# Patient Record
Sex: Female | Born: 1985 | Race: Black or African American | Hispanic: No | Smoking: Light tobacco smoker
Health system: Southern US, Community
[De-identification: ages and names within clinical notes are randomized; demographics above are authoritative.]

## PROBLEM LIST (undated history)

## (undated) DIAGNOSIS — J45909 Unspecified asthma, uncomplicated: Secondary | ICD-10-CM

## (undated) DIAGNOSIS — E785 Hyperlipidemia, unspecified: Secondary | ICD-10-CM

## (undated) DIAGNOSIS — F1910 Other psychoactive substance abuse, uncomplicated: Secondary | ICD-10-CM

## (undated) DIAGNOSIS — I10 Essential (primary) hypertension: Secondary | ICD-10-CM

## (undated) DIAGNOSIS — K219 Gastro-esophageal reflux disease without esophagitis: Secondary | ICD-10-CM

## (undated) HISTORY — DX: Other psychoactive substance abuse, uncomplicated: F19.10

## (undated) HISTORY — DX: Gastro-esophageal reflux disease without esophagitis: K21.9

## (undated) HISTORY — DX: Unspecified asthma, uncomplicated: J45.909

## (undated) HISTORY — DX: Essential (primary) hypertension: I10

## (undated) HISTORY — DX: Hyperlipidemia, unspecified: E78.5

## (undated) HISTORY — PX: NO PAST SURGERIES: SHX2092

---

## 2006-12-26 ENCOUNTER — Other Ambulatory Visit: Admission: RE | Admit: 2006-12-26 | Discharge: 2006-12-26 | Payer: Self-pay | Admitting: Family Medicine

## 2007-12-29 ENCOUNTER — Other Ambulatory Visit: Admission: RE | Admit: 2007-12-29 | Discharge: 2007-12-29 | Payer: Self-pay | Admitting: Family Medicine

## 2017-09-09 ENCOUNTER — Emergency Department (HOSPITAL_COMMUNITY)
Admission: EM | Admit: 2017-09-09 | Discharge: 2017-09-09 | Disposition: A | Discharge status: Home / Self Care | Payer: BLUE CROSS/BLUE SHIELD | Attending: Emergency Medicine | Admitting: Emergency Medicine

## 2017-09-09 ENCOUNTER — Encounter (HOSPITAL_COMMUNITY): Payer: Self-pay | Admitting: Emergency Medicine

## 2017-09-09 ENCOUNTER — Emergency Department (HOSPITAL_COMMUNITY): Payer: BLUE CROSS/BLUE SHIELD

## 2017-09-09 DIAGNOSIS — J9801 Acute bronchospasm: Secondary | ICD-10-CM | POA: Insufficient documentation

## 2017-09-09 DIAGNOSIS — F17210 Nicotine dependence, cigarettes, uncomplicated: Secondary | ICD-10-CM | POA: Diagnosis not present

## 2017-09-09 DIAGNOSIS — R0602 Shortness of breath: Secondary | ICD-10-CM | POA: Diagnosis present

## 2017-09-09 DIAGNOSIS — J189 Pneumonia, unspecified organism: Secondary | ICD-10-CM | POA: Insufficient documentation

## 2017-09-09 LAB — COMPREHENSIVE METABOLIC PANEL
ALT: 29 U/L (ref 14–54)
AST: 39 U/L (ref 15–41)
Albumin: 4.4 g/dL (ref 3.5–5.0)
Alkaline Phosphatase: 87 U/L (ref 38–126)
Anion gap: 8 (ref 5–15)
BUN: 13 mg/dL (ref 6–20)
CO2: 26 mmol/L (ref 22–32)
Calcium: 9.5 mg/dL (ref 8.9–10.3)
Chloride: 106 mmol/L (ref 101–111)
Creatinine, Ser: 1.08 mg/dL — ABNORMAL HIGH (ref 0.44–1.00)
GFR calc Af Amer: >60 mL/min (ref >60)
Glucose, Bld: 115 mg/dL — ABNORMAL HIGH (ref 65–99)
Potassium: 3.4 mmol/L — ABNORMAL LOW (ref 3.5–5.1)
Sodium: 140 mmol/L (ref 135–145)
Total Bilirubin: 0.5 mg/dL (ref 0.3–1.2)
Total Protein: 8.2 g/dL — ABNORMAL HIGH (ref 6.5–8.1)

## 2017-09-09 LAB — CBC WITH DIFFERENTIAL/PLATELET
BASOS ABS: 0.1 10*3/uL (ref 0.0–0.1)
BASOS PCT: 0 %
Eosinophils Absolute: 0.6 10*3/uL (ref 0.0–0.7)
Eosinophils Relative: 3 %
HCT: 45.1 % (ref 36.0–46.0)
Hemoglobin: 15 g/dL (ref 12.0–15.0)
LYMPHS PCT: 14 %
Lymphs Abs: 2.9 10*3/uL (ref 0.7–4.0)
MCH: 29.2 pg (ref 26.0–34.0)
MCHC: 33.3 g/dL (ref 30.0–36.0)
MCV: 87.7 fL (ref 78.0–100.0)
MONO ABS: 2.1 10*3/uL — AB (ref 0.1–1.0)
Monocytes Relative: 10 %
Neutro Abs: 16 10*3/uL — ABNORMAL HIGH (ref 1.7–7.7)
Neutrophils Relative %: 73 %
PLATELETS: 320 10*3/uL (ref 150–400)
RBC: 5.14 MIL/uL — ABNORMAL HIGH (ref 3.87–5.11)
RDW: 14.4 % (ref 11.5–15.5)
WBC: 21.8 10*3/uL — ABNORMAL HIGH (ref 4.0–10.5)

## 2017-09-09 LAB — I-STAT BETA HCG BLOOD, ED (MC, WL, AP ONLY)

## 2017-09-09 MED ORDER — ALBUTEROL SULFATE (2.5 MG/3ML) 0.083% IN NEBU
5.0000 mg | INHALATION_SOLUTION | Freq: Once | RESPIRATORY_TRACT | Status: AC
  Administered 2017-09-09: 5 mg via RESPIRATORY_TRACT
  Filled 2017-09-09: qty 6

## 2017-09-09 MED ORDER — ALBUTEROL SULFATE HFA 108 (90 BASE) MCG/ACT IN AERS
1.0000 | INHALATION_SPRAY | RESPIRATORY_TRACT | Status: DC | PRN
Start: 2017-09-09 — End: 2017-09-09
  Administered 2017-09-09: 2 via RESPIRATORY_TRACT
  Filled 2017-09-09: qty 6.7

## 2017-09-09 MED ORDER — AZITHROMYCIN 250 MG PO TABS: 250.0000 mg | ORAL_TABLET | Freq: Every day | ORAL | 0 refills | Status: DC

## 2017-09-09 MED ORDER — SODIUM CHLORIDE 0.9 % IV SOLN
1.0000 g | Freq: Once | INTRAVENOUS | Status: AC
  Administered 2017-09-09: 1 g via INTRAVENOUS
  Filled 2017-09-09: qty 10

## 2017-09-09 MED ORDER — METHYLPREDNISOLONE SODIUM SUCC 125 MG IJ SOLR
125.0000 mg | Freq: Once | INTRAMUSCULAR | Status: AC
Start: 2017-09-09 — End: 2017-09-09
  Administered 2017-09-09: 125 mg via INTRAVENOUS
  Filled 2017-09-09: qty 2

## 2017-09-09 MED ORDER — SODIUM CHLORIDE 0.9 % IV BOLUS
1000.0000 mL | Freq: Once | INTRAVENOUS | Status: AC
  Administered 2017-09-09: 1000 mL via INTRAVENOUS

## 2017-09-09 MED ORDER — PREDNISONE 20 MG PO TABS: ORAL_TABLET | ORAL | 0 refills | Status: DC

## 2017-09-09 MED ORDER — SODIUM CHLORIDE 0.9 % IV SOLN
500.0000 mg | Freq: Once | INTRAVENOUS | Status: AC
  Administered 2017-09-09 (×2): 500 mg via INTRAVENOUS
  Filled 2017-09-09: qty 500

## 2017-09-09 MED ORDER — SODIUM CHLORIDE 0.9 % IV SOLN
INTRAVENOUS | Status: AC
  Administered 2017-09-09: 500 mg via INTRAVENOUS
  Filled 2017-09-09: qty 500

## 2017-09-09 NOTE — ED Triage Notes (Signed)
Pt reports cough for the past 2 days. Shortness of breath since this morning.  Pt hyperventilating on arrival to ED.

## 2017-09-09 NOTE — ED Provider Notes (Signed)
[facility] EMERGENCY DEPARTMENT Provider Note   CSN: [id number] Arrival date & time: 09/09/17  0820     History   Chief Complaint Chief Complaint  Patient presents with  . Shortness of Breath    HPI Whitney BeauDanielle Priego is a 32 y.o. female.  HPI Patient presents with wheezing, nonproductive cough, rhinorrhea and shortness of breath for the past 2 days.  Worsened overnight.  Denies chest pain.  She is had some subjective fevers and chills.  No new lower extremity swelling or pain.  No previous diagnosis of asthma though she states she has had bronchitis frequently as a child. History reviewed. No pertinent past medical history.  There are no active problems to display for this patient.   History reviewed. No pertinent surgical history.   OB History   None      Home Medications    Prior to Admission medications   Medication Sig Start Date End Date Taking? Authorizing Provider  azithromycin (ZITHROMAX) 250 MG tablet Take 1 tablet (250 mg total) by mouth daily. 09/10/17   Loren RacerYelverton, Sabirin Baray, MD  predniSONE (DELTASONE) 20 MG tablet 3 tabs po day one, then 2 po daily x 4 days 09/10/17   Loren RacerYelverton, Danecia Underdown, MD    Family History History reviewed. No pertinent family history.  Social History Social History   Tobacco Use  . Smoking status: Current Every Day Smoker    Packs/day: 1.00    Types: Cigarettes  Substance Use Topics  . Alcohol use: Yes    Comment: every other day-beer  . Drug use: Yes    Types: Marijuana     Allergies   Patient has no known allergies.   Review of Systems Review of Systems  Constitutional: Positive for chills and fever.  HENT: Positive for rhinorrhea. Negative for sore throat and trouble swallowing.   Respiratory: Positive for cough, shortness of breath and wheezing.   Cardiovascular: Negative for chest pain, palpitations and leg swelling.  Gastrointestinal: Negative for abdominal pain, diarrhea, nausea and vomiting.  Genitourinary: Negative  for dysuria, flank pain and frequency.  Musculoskeletal: Negative for back pain, myalgias, neck pain and neck stiffness.  Skin: Negative for rash and wound.  Neurological: Negative for dizziness, weakness, light-headedness, numbness and headaches.  All other systems reviewed and are negative.    Physical Exam Updated Vital Signs BP 105/70   Pulse (!) 108   Temp 98.3 F (36.8 C) (Oral)   Resp 20   Ht 5\' 4"  (1.626 m)   Wt 78.9 kg (174 lb)   LMP 08/14/2017   SpO2 98%   BMI 29.87 kg/m   Physical Exam  Constitutional: She is oriented to person, place, and time. She appears well-developed and well-nourished.  HENT:  Head: Normocephalic and atraumatic.  Mouth/Throat: Oropharynx is clear and moist. No oropharyngeal exudate.  Eyes: Pupils are equal, round, and reactive to light. EOM are normal.  Neck: Normal range of motion. Neck supple. No JVD present.  Cardiovascular: Regular rhythm. Exam reveals no gallop and no friction rub.  No murmur heard. Tachycardia  Pulmonary/Chest: No respiratory distress. She has wheezes. She has rales.  Increased work of breathing.  Inspiratory and expiratory wheezing throughout with few rhonchi and bilateral bases.  Abdominal: Soft. Bowel sounds are normal. There is no tenderness. There is no rebound and no guarding.  Musculoskeletal: Normal range of motion. She exhibits no edema or tenderness.  No lower extremity swelling, asymmetry or tenderness.  Lymphadenopathy:    She has no cervical adenopathy.  Neurological: She is alert and oriented to person, place, and time.  Moves all extremities without focal deficit.  Sensation intact.  Skin: Skin is warm and dry. Capillary refill takes less than 2 seconds. No rash noted. She is not diaphoretic. No erythema.  Psychiatric: She has a normal mood and affect. Her behavior is normal.  Nursing note and vitals reviewed.    ED Treatments / Results  Labs (all labs ordered are listed, but only abnormal results  are displayed) Labs Reviewed  CBC WITH DIFFERENTIAL/PLATELET - Abnormal; Notable for the following components:      Result Value   WBC 21.8 (*)    RBC 5.14 (*)    Neutro Abs 16.0 (*)    Monocytes Absolute 2.1 (*)    All other components within normal limits  COMPREHENSIVE METABOLIC PANEL - Abnormal; Notable for the following components:   Potassium 3.4 (*)    Glucose, Bld 115 (*)    Creatinine, Ser 1.08 (*)    Total Protein 8.2 (*)    All other components within normal limits  I-STAT BETA HCG BLOOD, ED ([facility], [facility], [facility] ONLY)    EKG None  Radiology Dg Chest 2 View  Result Date: 09/09/2017 CLINICAL DATA:  Left-sided chest discomfort and shortness of breath. Current smoker. EXAM: CHEST - 2 VIEW COMPARISON:  None in PACs FINDINGS: The lungs are adequately inflated. The lung markings are mildly increased in the lingula. There is no pleural effusion or pneumothorax. The heart and pulmonary vascularity are normal. The mediastinum is normal in width. The bony thorax is unremarkable. IMPRESSION: Subtle increased density in the lingula laterally suggest atelectasis or possible early pneumonia. Follow-up radiographs following anticipated antibiotic therapy are recommended to assure clearing. Expansile remodeling of the lateral aspect of the left seventh rib of uncertain etiology and duration. A left rib series is recommended. Electronically Signed   By: Osceola Depaz  [place] M.D.   On: 09/09/2017 10:19    Procedures Procedures (including critical care time)  Medications Ordered in ED Medications  albuterol (PROVENTIL HFA;VENTOLIN HFA) 108 (90 Base) MCG/ACT inhaler 1-2 puff (2 puffs Inhalation Given 09/09/17 1228)  albuterol (PROVENTIL) (2.5 MG/3ML) 0.083% nebulizer solution 5 mg (5 mg Nebulization Given 09/09/17 0927)  methylPREDNISolone sodium succinate (SOLU-MEDROL) 125 mg/2 mL injection 125 mg (125 mg Intravenous Given 09/09/17 0932)  cefTRIAXone (ROCEPHIN) 1 g in sodium chloride 0.9 % 100 mL IVPB (0 g  Intravenous Stopped 09/09/17 1128)  sodium chloride 0.9 % bolus 1,000 mL (0 mLs Intravenous Stopped 09/09/17 1128)  azithromycin (ZITHROMAX) 500 mg in sodium chloride 0.9 % 250 mL IVPB (0 mg Intravenous Stopped 09/09/17 1339)     Initial Impression / Assessment and Plan / ED Course  I have reviewed the triage vital signs and the nursing notes.  Pertinent labs & imaging results that were available during my care of the patient were reviewed by me and considered in my medical decision making (see chart for details).     Patient states he is feeling much better after albuterol treatment.  Lungs are now clear.  Tachycardia has improved.  Subtle left lingular infiltrate likely developing pneumonia.  Given dose of IV Rocephin IV azithromycin.  Will discharge with azithromycin and MDI.  Return precautions have been given.  Final Clinical Impressions(s) / ED Diagnoses   Final diagnoses:  Community acquired pneumonia, unspecified laterality  Bronchospasm    ED Discharge Orders        Ordered    predniSONE (DELTASONE) 20 MG tablet  09/09/17 1330    azithromycin (ZITHROMAX) 250 MG tablet  Daily     09/09/17 1330       Loren Racer, MD 09/09/17 1519

## 2019-04-10 NOTE — L&D Delivery Note (Signed)
OB/GYN Faculty Practice Delivery Note  Whitney Vasquez is a 34 y.o. G1P0 s/p vaginal delivery at [redacted]w[redacted]d. She was admitted for IOL 2/2 IUFD in the context of +cocaine/placental abruption.   ROM: 1h 87m with clear fluid GBS Status: unknown Maximum Maternal Temperature: 98.9*F  Labor Progress: . Induction was started with several doses of cytotec. Eventually a FB was able to be placed. She was subsequently [facility] and started on pitocin, progressing to complete and delivering after a short second stage.  Delivery Date/Time: 11/19/19, 0407 Delivery: Called to room and patient was complete and pushing. Head delivered OA. No nuchal cord present. Shoulder and body delivered in usual fashion. Infant was dead upon delivery. Cord clamped x 2 and cut by FOB under my direct supervision. The infant was then wrapped in a blanket and given to the patient to hold. The cord avulsed as the placenta neared the introitus, and was subsequently delivered with the use of ring forceps- found to be intact upon close examination. Fundus firm with massage and Pitocin. Labia, perineum, vagina, and cervix were inspected, and a small right periurethral tear was noted- hemostatic and not requiring repair.   Placenta: 3 vessel cord-avulsed, intact, to pathology Complications: cord avulsion Lacerations: right peri-urethral- hemostatic and not repaired EBL: 250 mL Analgesia: Dilaudid- 4 mg  Postpartum Planning [x]  message to sent to schedule follow-up  [x]  vaccines UTD  Infant: female  weight per medical record  , DO OB/GYN Fellow, Faculty Practice

## 2019-05-08 ENCOUNTER — Ambulatory Visit: Payer: BC Managed Care – PPO

## 2019-05-08 ENCOUNTER — Other Ambulatory Visit: Payer: Self-pay

## 2019-05-08 ENCOUNTER — Ambulatory Visit: Payer: BC Managed Care – PPO | Attending: Internal Medicine

## 2019-05-08 DIAGNOSIS — Z20822 Contact with and (suspected) exposure to covid-19: Secondary | ICD-10-CM

## 2019-05-09 LAB — NOVEL CORONAVIRUS, NAA: SARS-CoV-2, NAA: NOT DETECTED

## 2019-05-27 ENCOUNTER — Other Ambulatory Visit: Payer: Self-pay

## 2019-05-27 ENCOUNTER — Encounter: Payer: Self-pay | Admitting: Adult Health

## 2019-05-27 ENCOUNTER — Ambulatory Visit (INDEPENDENT_AMBULATORY_CARE_PROVIDER_SITE_OTHER): Payer: BC Managed Care – PPO | Admitting: Adult Health

## 2019-05-27 VITALS — BP 127/81 | HR 87 | Ht 64.0 in | Wt 197.4 lb

## 2019-05-27 DIAGNOSIS — O3680X0 Pregnancy with inconclusive fetal viability, not applicable or unspecified: Secondary | ICD-10-CM | POA: Diagnosis not present

## 2019-05-27 DIAGNOSIS — Z3A01 Less than 8 weeks gestation of pregnancy: Secondary | ICD-10-CM | POA: Diagnosis not present

## 2019-05-27 DIAGNOSIS — Z3201 Encounter for pregnancy test, result positive: Secondary | ICD-10-CM | POA: Diagnosis not present

## 2019-05-27 LAB — POCT URINE PREGNANCY: Preg Test, Ur: POSITIVE — AB

## 2019-05-27 MED ORDER — ALBUTEROL SULFATE HFA 108 (90 BASE) MCG/ACT IN AERS: 2.0000 | INHALATION_SPRAY | Freq: Four times a day (QID) | RESPIRATORY_TRACT | 1 refills | Status: DC | PRN

## 2019-05-27 NOTE — Progress Notes (Signed)
  Subjective:     Patient ID: Whitney Vasquez, female   DOB: 05-09-1985, 34 y.o.   MRN: [id number]  HPI Whitney Vasquez is a 34 year old black female, single with SO in for UPT, has missed a period and had 5+HPTs. 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   Review of Systems +missed period with +5 HPTs +moody Reviewed past medical,surgical, social and family history. Reviewed medications and allergies.     Objective:   Physical Exam BP 127/81 (BP Location: Left Arm, Patient Position: Sitting, Cuff Size: Normal)   Pulse 87   Ht 5\' 4"  (1.626 m)   Wt 197 lb 6.4 oz (89.5 kg)   LMP 04/07/2019 (Exact Date)   BMI 33.88 kg/m UPT +, about 7+1 week by LMP with EDD 01/12/20. Skin warm and dry. Neck: mid line trachea, normal thyroid, good ROM, no lymphadenopathy noted. Lungs: clear to ausculation bilaterally. Cardiovascular: regular rate and rhythm.abdomen is soft and non tender Fall risk is low PHQ 2 score is 0 She requested refill on albuterol inhaler, will do     Assessment:     1. Positive urine pregnancy test Continue OTC PN Gummies Meds ordered this encounter  Medications  . albuterol (VENTOLIN HFA) 108 (90 Base) MCG/ACT inhaler    Sig: Inhale 2 puffs into the lungs every 6 (six) hours as needed for wheezing or shortness of breath.    Dispense:  18 g    Refill:  1    Order Specific Question:   Supervising Provider    Answer:   03/13/20, LUTHER H [[id number]]    2. Less than [redacted] weeks gestation of pregnancy   3. Encounter to determine fetal viability of pregnancy, single or unspecified fetus Dating Whitney Vasquez in about 1 week or first available     Plan:     Review handouts on first trimester and by [facility]

## 2019-05-27 NOTE — Patient Instructions (Signed)
First Trimester of Pregnancy The first trimester of pregnancy is from week 1 until the end of week 13 (months 1 through 3). A week after a sperm fertilizes an egg, the egg will implant on the wall of the uterus. This embryo will begin to develop into a baby. Genes from you and your partner will form the baby. The female genes will determine whether the baby will be a boy or a girl. At 6-8 weeks, the eyes and face will be formed, and the heartbeat can be seen on ultrasound. At the end of 12 weeks, all the baby's organs will be formed. Now that you are pregnant, you will want to do everything you can to have a healthy baby. Two of the most important things are to get good prenatal care and to follow your health care provider's instructions. Prenatal care is all the medical care you receive before the baby's birth. This care will help prevent, find, and treat any problems during the pregnancy and childbirth. Body changes during your first trimester Your body goes through many changes during pregnancy. The changes vary from woman to woman.  You may gain or lose a couple of pounds at first.  You may feel sick to your stomach (nauseous) and you may throw up (vomit). If the vomiting is uncontrollable, call your health care provider.  You may tire easily.  You may develop headaches that can be relieved by medicines. All medicines should be approved by your health care provider.  You may urinate more often. Painful urination may mean you have a bladder infection.  You may develop heartburn as a result of your pregnancy.  You may develop constipation because certain hormones are causing the muscles that push stool through your intestines to slow down.  You may develop hemorrhoids or swollen veins (varicose veins).  Your breasts may begin to grow larger and become tender. Your nipples may stick out more, and the tissue that surrounds them (areola) may become darker.  Your gums may bleed and may be  sensitive to brushing and flossing.  Dark spots or blotches (chloasma, mask of pregnancy) may develop on your face. This will likely fade after the baby is born.  Your menstrual periods will stop.  You may have a loss of appetite.  You may develop cravings for certain kinds of food.  You may have changes in your emotions from day to day, such as being excited to be pregnant or being concerned that something may go wrong with the pregnancy and baby.  You may have more vivid and strange dreams.  You may have changes in your hair. These can include thickening of your hair, rapid growth, and changes in texture. Some women also have hair loss during or after pregnancy, or hair that feels dry or thin. Your hair will most likely return to normal after your baby is born. What to expect at prenatal visits During a routine prenatal visit:  You will be weighed to make sure you and the baby are growing normally.  Your blood pressure will be taken.  Your abdomen will be measured to track your baby's growth.  The fetal heartbeat will be listened to between weeks 10 and 14 of your pregnancy.  Test results from any previous visits will be discussed. Your health care provider may ask you:  How you are feeling.  If you are feeling the baby move.  If you have had any abnormal symptoms, such as leaking fluid, bleeding, severe headaches, or abdominal   cramping.  If you are using any tobacco products, including cigarettes, chewing tobacco, and electronic cigarettes.  If you have any questions. Other tests that may be performed during your first trimester include:  Blood tests to find your blood type and to check for the presence of any previous infections. The tests will also be used to check for low iron levels (anemia) and protein on red blood cells (Rh antibodies). Depending on your risk factors, or if you previously had diabetes during pregnancy, you may have tests to check for high blood sugar  that affects pregnant women (gestational diabetes).  Urine tests to check for infections, diabetes, or protein in the urine.  An ultrasound to confirm the proper growth and development of the baby.  Fetal screens for spinal cord problems (spina bifida) and Down syndrome.  HIV (human immunodeficiency virus) testing. Routine prenatal testing includes screening for HIV, unless you choose not to have this test.  You may need other tests to make sure you and the baby are doing well. Follow these instructions at home: Medicines  Follow your health care provider's instructions regarding medicine use. Specific medicines may be either safe or unsafe to take during pregnancy.  Take a prenatal vitamin that contains at least 600 micrograms (mcg) of folic acid.  If you develop constipation, try taking a stool softener if your health care provider approves. Eating and drinking   Eat a balanced diet that includes fresh fruits and vegetables, whole grains, good sources of protein such as meat, eggs, or tofu, and low-fat dairy. Your health care provider will help you determine the amount of weight gain that is right for you.  Avoid raw meat and uncooked cheese. These carry germs that can cause birth defects in the baby.  Eating four or five small meals rather than three large meals a day may help relieve nausea and vomiting. If you start to feel nauseous, eating a few soda crackers can be helpful. Drinking liquids between meals, instead of during meals, also seems to help ease nausea and vomiting.  Limit foods that are high in fat and processed sugars, such as fried and sweet foods.  To prevent constipation: ? Eat foods that are high in fiber, such as fresh fruits and vegetables, whole grains, and beans. ? Drink enough fluid to keep your urine clear or pale yellow. Activity  Exercise only as directed by your health care provider. Most women can continue their usual exercise routine during  pregnancy. Try to exercise for 30 minutes at least 5 days a week. Exercising will help you: ? Control your weight. ? Stay in shape. ? Be prepared for labor and delivery.  Experiencing pain or cramping in the lower abdomen or lower back is a good sign that you should stop exercising. Check with your health care provider before continuing with normal exercises.  Try to avoid standing for long periods of time. Move your legs often if you must stand in one place for a long time.  Avoid heavy lifting.  Wear low-heeled shoes and practice good posture.  You may continue to have sex unless your health care provider tells you not to. Relieving pain and discomfort  Wear a good support bra to relieve breast tenderness.  Take warm sitz baths to soothe any pain or discomfort caused by hemorrhoids. Use hemorrhoid cream if your health care provider approves.  Rest with your legs elevated if you have leg cramps or low back pain.  If you develop varicose veins in   your legs, wear support hose. Elevate your feet for 15 minutes, 3-4 times a day. Limit salt in your diet. Prenatal care  Schedule your prenatal visits by the twelfth week of pregnancy. They are usually scheduled monthly at first, then more often in the last 2 months before delivery.  Write down your questions. Take them to your prenatal visits.  Keep all your prenatal visits as told by your health care provider. This is important. Safety  Wear your seat belt at all times when driving.  Make a list of emergency phone numbers, including numbers for family, friends, the hospital, and police and fire departments. General instructions  Ask your health care provider for a referral to a local prenatal education class. Begin classes no later than the beginning of month 6 of your pregnancy.  Ask for help if you have counseling or nutritional needs during pregnancy. Your health care provider can offer advice or refer you to specialists for help  with various needs.  Do not use hot tubs, steam rooms, or saunas.  Do not douche or use tampons or scented sanitary pads.  Do not cross your legs for long periods of time.  Avoid cat litter boxes and soil used by cats. These carry germs that can cause birth defects in the baby and possibly loss of the fetus by miscarriage or stillbirth.  Avoid all smoking, herbs, alcohol, and medicines not prescribed by your health care provider. Chemicals in these products affect the formation and growth of the baby.  Do not use any products that contain nicotine or tobacco, such as cigarettes and e-cigarettes. If you need help quitting, ask your health care provider. You may receive counseling support and other resources to help you quit.  Schedule a dentist appointment. At home, brush your teeth with a soft toothbrush and be gentle when you floss. Contact a health care provider if:  You have dizziness.  You have mild pelvic cramps, pelvic pressure, or nagging pain in the abdominal area.  You have persistent nausea, vomiting, or diarrhea.  You have a bad smelling vaginal discharge.  You have pain when you urinate.  You notice increased swelling in your face, hands, legs, or ankles.  You are exposed to fifth disease or chickenpox.  You are exposed to German measles (rubella) and have never had it. Get help right away if:  You have a fever.  You are leaking fluid from your vagina.  You have spotting or bleeding from your vagina.  You have severe abdominal cramping or pain.  You have rapid weight gain or loss.  You vomit blood or material that looks like coffee grounds.  You develop a severe headache.  You have shortness of breath.  You have any kind of trauma, such as from a fall or a car accident. Summary  The first trimester of pregnancy is from week 1 until the end of week 13 (months 1 through 3).  Your body goes through many changes during pregnancy. The changes vary from  woman to woman.  You will have routine prenatal visits. During those visits, your health care provider will examine you, discuss any test results you may have, and talk with you about how you are feeling. This information is not intended to replace advice given to you by your health care provider. Make sure you discuss any questions you have with your health care provider. Document Revised: 03/08/2017 Document Reviewed: 03/07/2016 Elsevier Patient Education  2020 [employer]  

## 2019-06-04 ENCOUNTER — Other Ambulatory Visit: Payer: Self-pay

## 2019-06-04 ENCOUNTER — Ambulatory Visit (INDEPENDENT_AMBULATORY_CARE_PROVIDER_SITE_OTHER): Payer: BC Managed Care – PPO

## 2019-06-04 DIAGNOSIS — O3680X0 Pregnancy with inconclusive fetal viability, not applicable or unspecified: Secondary | ICD-10-CM | POA: Diagnosis not present

## 2019-06-04 DIAGNOSIS — Z3A08 8 weeks gestation of pregnancy: Secondary | ICD-10-CM | POA: Diagnosis not present

## 2019-06-04 NOTE — Progress Notes (Signed)
[place] 7+1 wks,single IUP with YS,CRL 10.17 mm,small subchorionic hemorrhage 1.6 x .5 x .4 cm,normal ovaries

## 2019-07-07 ENCOUNTER — Other Ambulatory Visit: Payer: Self-pay | Admitting: Obstetrics & Gynecology

## 2019-07-07 DIAGNOSIS — Z3682 Encounter for antenatal screening for nuchal translucency: Secondary | ICD-10-CM

## 2019-07-08 ENCOUNTER — Encounter: Payer: Self-pay | Admitting: Women's Health

## 2019-07-08 ENCOUNTER — Ambulatory Visit (INDEPENDENT_AMBULATORY_CARE_PROVIDER_SITE_OTHER): Payer: BC Managed Care – PPO

## 2019-07-08 ENCOUNTER — Ambulatory Visit (INDEPENDENT_AMBULATORY_CARE_PROVIDER_SITE_OTHER): Payer: BC Managed Care – PPO | Admitting: Women's Health

## 2019-07-08 ENCOUNTER — Other Ambulatory Visit: Payer: Self-pay

## 2019-07-08 ENCOUNTER — Ambulatory Visit: Payer: BC Managed Care – PPO | Admitting: *Deleted

## 2019-07-08 ENCOUNTER — Encounter: Payer: Self-pay | Admitting: *Deleted

## 2019-07-08 ENCOUNTER — Other Ambulatory Visit (HOSPITAL_COMMUNITY)
Admission: RE | Admit: 2019-07-08 | Discharge: 2019-07-08 | Disposition: A | Discharge status: Home / Self Care | Payer: BC Managed Care – PPO | Source: Ambulatory Visit | Attending: Obstetrics & Gynecology | Admitting: Obstetrics & Gynecology

## 2019-07-08 VITALS — BP 121/81 | HR 76 | Wt 203.0 lb

## 2019-07-08 DIAGNOSIS — Z01419 Encounter for gynecological examination (general) (routine) without abnormal findings: Secondary | ICD-10-CM | POA: Insufficient documentation

## 2019-07-08 DIAGNOSIS — Z3682 Encounter for antenatal screening for nuchal translucency: Secondary | ICD-10-CM | POA: Diagnosis not present

## 2019-07-08 DIAGNOSIS — Z3A12 12 weeks gestation of pregnancy: Secondary | ICD-10-CM | POA: Diagnosis not present

## 2019-07-08 DIAGNOSIS — O0990 Supervision of high risk pregnancy, unspecified, unspecified trimester: Secondary | ICD-10-CM | POA: Insufficient documentation

## 2019-07-08 DIAGNOSIS — Z1379 Encounter for other screening for genetic and chromosomal anomalies: Secondary | ICD-10-CM

## 2019-07-08 DIAGNOSIS — Z3400 Encounter for supervision of normal first pregnancy, unspecified trimester: Secondary | ICD-10-CM | POA: Insufficient documentation

## 2019-07-08 DIAGNOSIS — Z363 Encounter for antenatal screening for malformations: Secondary | ICD-10-CM | POA: Diagnosis not present

## 2019-07-08 DIAGNOSIS — Z131 Encounter for screening for diabetes mellitus: Secondary | ICD-10-CM | POA: Diagnosis not present

## 2019-07-08 DIAGNOSIS — Z3401 Encounter for supervision of normal first pregnancy, first trimester: Secondary | ICD-10-CM

## 2019-07-08 LAB — POCT URINALYSIS DIPSTICK OB
Blood, UA: NEGATIVE
Glucose, UA: NEGATIVE
Ketones, UA: NEGATIVE
Leukocytes, UA: NEGATIVE
Nitrite, UA: NEGATIVE
POC,PROTEIN,UA: NEGATIVE

## 2019-07-08 MED ORDER — BLOOD PRESSURE MONITOR MISC: 0 refills | Status: DC

## 2019-07-08 NOTE — Progress Notes (Signed)
INITIAL OBSTETRICAL VISIT Patient name: Whitney Vasquez MRN [id number]  Date of birth: 1986-02-24 Chief Complaint:   Initial Prenatal Visit  History of Present Illness:   Whitney Vasquez is a 34 y.o. G43P0 African American female at [redacted]w[redacted]d by 7wk u/s, with an Estimated Date of Delivery: 01/20/20 being seen today for her initial obstetrical visit.   Her obstetrical history is significant for primigravida. Previous smoker, quit w/ +PT.   Today she reports no complaints.  Depression screen [facility] 2/9 07/08/2019 05/27/2019  Decreased Interest 0 0  Down, Depressed, Hopeless 0 0  PHQ - 2 Score 0 0  Altered sleeping 1 -  Tired, decreased energy 1 -  Change in appetite 0 -  Feeling bad or failure about yourself  0 -  Trouble concentrating 0 -  Moving slowly or fidgety/restless 0 -  Suicidal thoughts 0 -  PHQ-9 Score 2 -  Difficult doing work/chores Not difficult at all -    Patient's last menstrual period was 04/07/2019 (exact date). Last pap 2013. Results were: normal Review of Systems:   Pertinent items are noted in HPI Denies cramping/contractions, leakage of fluid, vaginal bleeding, abnormal vaginal discharge w/ itching/odor/irritation, headaches, visual changes, shortness of breath, chest pain, abdominal pain, severe nausea/vomiting, or problems with urination or bowel movements unless otherwise stated above.  Pertinent History Reviewed:  Reviewed past medical,surgical, social, obstetrical and family history.  Reviewed problem list, medications and allergies. OB History  Gravida Para Term Preterm AB Living  1            SAB TAB Ectopic Multiple Live Births               # Outcome Date GA Lbr Len/2nd Weight Sex Delivery Anes PTL Lv  1 Current            Physical Assessment:   Vitals:   07/08/19 1037  BP: 121/81  Pulse: 76  Weight: 203 lb (92.1 kg)  Body mass index is 34.84 kg/m.       Physical Examination:  General appearance - well appearing, and in no  distress  Mental status - alert, oriented to person, place, and time  Psych:  She has a normal mood and affect  Skin - warm and dry, normal color, no suspicious lesions noted  Chest - effort normal, all lung fields clear to auscultation bilaterally  Heart - normal rate and regular rhythm  Abdomen - soft, nontender  Extremities:  No swelling or varicosities noted  Pelvic - VULVA: normal appearing vulva with no masses, tenderness or lesions  VAGINA: normal appearing vagina with normal color and discharge, no lesions  CERVIX: normal appearing cervix without discharge or lesions, no CMT  Thin prep pap is done w/ HR HPV cotesting  TODAY'S NT [place] 12 wks,measurements c/w dates,anterior placenta,normal ovaries,fhr 176 bpm,crl 53.55 mm,unable to obtain NT or view NB because of fetal position,please have pt come back today for additional images   Results for orders placed or performed in visit on 07/08/19 (from the past 24 hour(s))  POC Urinalysis Dipstick OB   Collection Time: 07/08/19 10:47 AM  Result Value Ref Range   Color, UA     Clarity, UA     Glucose, UA Negative Negative   Bilirubin, UA     Ketones, UA neg    Spec Grav, UA     Blood, UA neg    pH, UA     POC,PROTEIN,UA Negative Negative, Trace, Small (1+), Moderate (2+), Large (3+),  4+   Urobilinogen, UA     Nitrite, UA neg    Leukocytes, UA Negative Negative   Appearance     Odor      Assessment & Plan:  1) Low-Risk Pregnancy G1P0 at [redacted]w[redacted]d with an Estimated Date of Delivery: 01/20/20   2) Initial OB visit  Meds:  Meds ordered this encounter  Medications  . Blood Pressure Monitor MISC    Sig: For regular home bp monitoring during pregnancy    Dispense:  1 each    Refill:  0    Z34.00    Initial labs obtained Continue prenatal vitamins Reviewed n/v relief measures and warning s/s to report Reviewed recommended weight gain based on pre-gravid BMI Encouraged well-balanced diet Genetic Screening discussed: requested  nt/it, declined maternit21. If unable to get nt will do AFP next visit Cystic fibrosis, SMA, Fragile X screening discussed declined Ultrasound discussed; fetal survey: requested CCNC completed>PCM not here, form faxed The nature of [facility] [employer] with multiple MDs and other Advanced Practice Providers was explained to patient; also emphasized that fellows, residents, and students are part of our team. Does not have  home bp cuff. Applying for preg mcaid, will wait to see if she gets it to rx, if doesn't will need to purchase  Indications for ASA therapy (per uptodate) Two or more of the following: Nulliparity Yes Obesity (body mass index >30 kg/m2) Yes  Sociodemographic characteristics (African American race, low socioeconomic level) Yes    Indications for early 1 hour GTT (per uptodate) BMI >=25 (>=23 in Asian women) AND one of the following First-degree relative with diabetes Yes High-risk race/ethnicity (eg, African American, Latino, Native American, [place] American, [place] Islander) Yes  Follow-up: Return in about 6 weeks (around 08/19/2019) for LROB, UY:[id number], 2nd IT, in person, CNM.   Orders Placed This Encounter  Procedures  . Urine Culture  . GC/Chlamydia Probe Amp  . US OB Comp + 14 Wk  . Obstetric Panel, Including HIV  . Hepatitis C antibody  . Hgb Fractionation Cascade  . Pain Management Screening Profile (10S)  . Hemoglobin A1c  . POC Urinalysis Dipstick OB    Cheral Marker CNM, [facility] 07/08/2019 11:15 AM

## 2019-07-08 NOTE — Progress Notes (Signed)
[place] 12 wks,measurements c/w dates,anterior placenta,normal ovaries,fhr 176 bpm,crl 53.55 mm,unable to obtain NT or view NB because of fetal position,please have pt come back today for additional images

## 2019-07-08 NOTE — Addendum Note (Signed)
Addended by: Colen Darling on: 07/08/2019 11:34 AM   Modules accepted: Orders

## 2019-07-08 NOTE — Patient Instructions (Addendum)
Whitney Vasquez, I greatly value your feedback.  If you receive a survey following your visit with [place] today, we appreciate you taking the time to fill it out.  Thanks, Joellyn Haff, CNM, WHNP-BC   [facility] ([address] [place], [place] [address]) Entrance C, located off of [address] valet parking   Begin taking 162mg  (two 81mg  tablets) baby aspirin daily to decrease the risk of preeclampsia during pregnancy      Nausea & Vomiting  Have saltine crackers or pretzels by your bed and eat a few bites before you raise your head out of bed in the morning  Eat small frequent meals throughout the day instead of large meals  Drink plenty of fluids throughout the day to stay hydrated, just don't drink a lot of fluids with your meals.  This can make your stomach fill up faster making you feel sick  Do not brush your teeth right after you eat  Products with real ginger are good for nausea, like ginger ale and ginger hard candy Make sure it says made with real ginger!  Sucking on sour candy like lemon heads is also good for nausea  If your prenatal vitamins make you nauseated, take them at night so you will sleep through the nausea  Sea Bands  If you feel like you need medicine for the nausea & vomiting please let know  If you are unable to keep any fluids or food down please let know   Constipation  Drink plenty of fluid, preferably water, throughout the day  Eat foods high in fiber such as fruits, vegetables, and grains  Exercise, such as walking, is a good way to keep your bowels regular  Drink warm fluids, especially warm prune juice, or decaf coffee  Eat a 1/2 cup of real oatmeal (not instant), 1/2 cup applesauce, and 1/2-1 cup warm prune juice every day  If needed, you may take Colace (docusate sodium) stool softener once or twice a day to help keep the stool soft.   If you still are having problems with constipation, you  may take Miralax once daily as needed to help keep your bowels regular.   Home Blood Pressure Monitoring for Patients   Your provider has recommended that you check your blood pressure (BP) at least once a week at home. If you do not have a blood pressure cuff at home, one will be provided for you. Contact your provider if you have not received your monitor within 1 week.   Helpful Tips for Accurate Home Blood Pressure Checks  . Don't smoke, exercise, or drink caffeine 30 minutes before checking your BP . Use the restroom before checking your BP (a full bladder can raise your pressure) . Relax in a comfortable upright chair . Feet on the ground . Left arm resting comfortably on a flat surface at the level of your heart . Legs uncrossed . Back supported . Sit quietly and don't talk . Place the cuff on your bare arm . Adjust snuggly, so that only two fingertips can fit between your skin and the top of the cuff . Check 2 readings separated by at least one minute . Keep a log of your BP readings . For a visual, please reference this diagram: http://ccnc.care/bpdiagram  Provider Name: [facility]     Phone: [phone]  Zone 1: ALL CLEAR  Continue to monitor your symptoms:  . BP reading is less  than 140 (top number) or less than 90 (bottom number)  . No right upper stomach pain . No headaches or seeing spots . No feeling nauseated or throwing up . No swelling in face and hands  Zone 2: CAUTION Call your doctor's office for any of the following:  . BP reading is greater than 140 (top number) or greater than 90 (bottom number)  . Stomach pain under your ribs in the middle or right side . Headaches or seeing spots . Feeling nauseated or throwing up . Swelling in face and hands  Zone 3: EMERGENCY  Seek immediate medical care if you have any of the following:  . BP reading is greater than160 (top number) or greater than 110 (bottom number) . Severe headaches not improving with  Tylenol . Serious difficulty catching your breath . Any worsening symptoms from Zone 2    First Trimester of Pregnancy The first trimester of pregnancy is from week 1 until the end of week 12 (months 1 through 3). A week after a sperm fertilizes an egg, the egg will implant on the wall of the uterus. This embryo will begin to develop into a baby. Genes from you and your partner are forming the baby. The female genes determine whether the baby is a boy or a girl. At 6-8 weeks, the eyes and face are formed, and the heartbeat can be seen on ultrasound. At the end of 12 weeks, all the baby's organs are formed.  Now that you are pregnant, you will want to do everything you can to have a healthy baby. Two of the most important things are to get good prenatal care and to follow your health care provider's instructions. Prenatal care is all the medical care you receive before the baby's birth. This care will help prevent, find, and treat any problems during the pregnancy and childbirth. BODY CHANGES Your body goes through many changes during pregnancy. The changes vary from woman to woman.   You may gain or lose a couple of pounds at first.  You may feel sick to your stomach (nauseous) and throw up (vomit). If the vomiting is uncontrollable, call your health care provider.  You may tire easily.  You may develop headaches that can be relieved by medicines approved by your health care provider.  You may urinate more often. Painful urination may mean you have a bladder infection.  You may develop heartburn as a result of your pregnancy.  You may develop constipation because certain hormones are causing the muscles that push waste through your intestines to slow down.  You may develop hemorrhoids or swollen, bulging veins (varicose veins).  Your breasts may begin to grow larger and become tender. Your nipples may stick out more, and the tissue that surrounds them (areola) may become darker.  Your gums  may bleed and may be sensitive to brushing and flossing.  Dark spots or blotches (chloasma, mask of pregnancy) may develop on your face. This will likely fade after the baby is born.  Your menstrual periods will stop.  You may have a loss of appetite.  You may develop cravings for certain kinds of food.  You may have changes in your emotions from day to day, such as being excited to be pregnant or being concerned that something may go wrong with the pregnancy and baby.  You may have more vivid and strange dreams.  You may have changes in your hair. These can include thickening of your hair, rapid growth, and  changes in texture. Some women also have hair loss during or after pregnancy, or hair that feels dry or thin. Your hair will most likely return to normal after your baby is born. WHAT TO EXPECT AT YOUR PRENATAL VISITS During a routine prenatal visit:  You will be weighed to make sure you and the baby are growing normally.  Your blood pressure will be taken.  Your abdomen will be measured to track your baby's growth.  The fetal heartbeat will be listened to starting around week 10 or 12 of your pregnancy.  Test results from any previous visits will be discussed. Your health care provider may ask you:  How you are feeling.  If you are feeling the baby move.  If you have had any abnormal symptoms, such as leaking fluid, bleeding, severe headaches, or abdominal cramping.  If you have any questions. Other tests that may be performed during your first trimester include:  Blood tests to find your blood type and to check for the presence of any previous infections. They will also be used to check for low iron levels (anemia) and Rh antibodies. Later in the pregnancy, blood tests for diabetes will be done along with other tests if problems develop.  Urine tests to check for infections, diabetes, or protein in the urine.  An ultrasound to confirm the proper growth and development  of the baby.  An amniocentesis to check for possible genetic problems.  Fetal screens for spina bifida and Down syndrome.  You may need other tests to make sure you and the baby are doing well. HOME CARE INSTRUCTIONS  Medicines  Follow your health care provider's instructions regarding medicine use. Specific medicines may be either safe or unsafe to take during pregnancy.  Take your prenatal vitamins as directed.  If you develop constipation, try taking a stool softener if your health care provider approves. Diet  Eat regular, well-balanced meals. Choose a variety of foods, such as meat or vegetable-based protein, fish, milk and low-fat dairy products, vegetables, fruits, and whole grain breads and cereals. Your health care provider will help you determine the amount of weight gain that is right for you.  Avoid raw meat and uncooked cheese. These carry germs that can cause birth defects in the baby.  Eating four or five small meals rather than three large meals a day may help relieve nausea and vomiting. If you start to feel nauseous, eating a few soda crackers can be helpful. Drinking liquids between meals instead of during meals also seems to help nausea and vomiting.  If you develop constipation, eat more high-fiber foods, such as fresh vegetables or fruit and whole grains. Drink enough fluids to keep your urine clear or pale yellow. Activity and Exercise  Exercise only as directed by your health care provider. Exercising will help you:  Control your weight.  Stay in shape.  Be prepared for labor and delivery.  Experiencing pain or cramping in the lower abdomen or low back is a good sign that you should stop exercising. Check with your health care provider before continuing normal exercises.  Try to avoid standing for long periods of time. Move your legs often if you must stand in one place for a long time.  Avoid heavy lifting.  Wear low-heeled shoes, and practice good  posture.  You may continue to have sex unless your health care provider directs you otherwise. Relief of Pain or Discomfort  Wear a good support bra for breast tenderness.  Take warm sitz baths to soothe any pain or discomfort caused by hemorrhoids. Use hemorrhoid cream if your health care provider approves.    Rest with your legs elevated if you have leg cramps or low back pain.  If you develop varicose veins in your legs, wear support hose. Elevate your feet for 15 minutes, 3-4 times a day. Limit salt in your diet. Prenatal Care  Schedule your prenatal visits by the twelfth week of pregnancy. They are usually scheduled monthly at first, then more often in the last 2 months before delivery.  Write down your questions. Take them to your prenatal visits.  Keep all your prenatal visits as directed by your health care provider. Safety  Wear your seat belt at all times when driving.  Make a list of emergency phone numbers, including numbers for family, friends, the hospital, and police and fire departments. General Tips  Ask your health care provider for a referral to a local prenatal education class. Begin classes no later than at the beginning of month 6 of your pregnancy.  Ask for help if you have counseling or nutritional needs during pregnancy. Your health care provider can offer advice or refer you to specialists for help with various needs.  Do not use hot tubs, steam rooms, or saunas.  Do not douche or use tampons or scented sanitary pads.  Do not cross your legs for long periods of time.  Avoid cat litter boxes and soil used by cats. These carry germs that can cause birth defects in the baby and possibly loss of the fetus by miscarriage or stillbirth.  Avoid all smoking, herbs, alcohol, and medicines not prescribed by your health care provider. Chemicals in these affect the formation and growth of the baby.  Schedule a dentist appointment. At home, brush your teeth with  a soft toothbrush and be gentle when you floss. SEEK MEDICAL CARE IF:   You have dizziness.  You have mild pelvic cramps, pelvic pressure, or nagging pain in the abdominal area.  You have persistent nausea, vomiting, or diarrhea.  You have a bad smelling vaginal discharge.  You have pain with urination.  You notice increased swelling in your face, hands, legs, or ankles. SEEK IMMEDIATE MEDICAL CARE IF:   You have a fever.  You are leaking fluid from your vagina.  You have spotting or bleeding from your vagina.  You have severe abdominal cramping or pain.  You have rapid weight gain or loss.  You vomit blood or material that looks like coffee grounds.  You are exposed to [place] measles and have never had them.  You are exposed to fifth disease or chickenpox.  You develop a severe headache.  You have shortness of breath.  You have any kind of trauma, such as from a fall or a car accident. Document Released: 03/20/2001 Document Revised: 08/10/2013 Document Reviewed: 02/03/2013 [facility] Patient Information 2015 [place], [place]. This information is not intended to replace advice given to you by your health care provider. Make sure you discuss any questions you have with your health care provider.

## 2019-07-08 NOTE — Addendum Note (Signed)
Addended by: Lynnell Dike on: 07/08/2019 11:43 AM   Modules accepted: Orders

## 2019-07-09 ENCOUNTER — Encounter: Payer: Self-pay | Admitting: Women's Health

## 2019-07-09 DIAGNOSIS — F1490 Cocaine use, unspecified, uncomplicated: Secondary | ICD-10-CM | POA: Insufficient documentation

## 2019-07-09 LAB — PMP SCREEN PROFILE (10S), URINE
Amphetamine Scrn, Ur: NEGATIVE ng/mL
BARBITURATE SCREEN URINE: NEGATIVE ng/mL
BENZODIAZEPINE SCREEN, URINE: NEGATIVE ng/mL
CANNABINOIDS UR QL SCN: NEGATIVE ng/mL
Cocaine (Metab) Scrn, Ur: POSITIVE ng/mL — AB
Creatinine(Crt), U: 97.5 mg/dL (ref 20.0–300.0)
Methadone Screen, Urine: NEGATIVE ng/mL
OXYCODONE+OXYMORPHONE UR QL SCN: NEGATIVE ng/mL
Opiate Scrn, Ur: NEGATIVE ng/mL
Ph of Urine: 6.8 (ref 4.5–8.9)
Phencyclidine Qn, Ur: NEGATIVE ng/mL
Propoxyphene Scrn, Ur: NEGATIVE ng/mL

## 2019-07-09 LAB — HEMOGLOBIN A1C
Est. average glucose Bld gHb Est-mCnc: 105 mg/dL
Hgb A1c MFr Bld: 5.3 % (ref 4.8–5.6)

## 2019-07-10 LAB — OBSTETRIC PANEL, INCLUDING HIV
Antibody Screen: NEGATIVE
Basophils Absolute: 0.1 10*3/uL (ref 0.0–0.2)
Basos: 1 %
EOS (ABSOLUTE): 0.5 10*3/uL — ABNORMAL HIGH (ref 0.0–0.4)
Eos: 3 %
HIV Screen 4th Generation wRfx: NONREACTIVE
Hematocrit: 38.9 % (ref 34.0–46.6)
Hemoglobin: 13.2 g/dL (ref 11.1–15.9)
Hepatitis B Surface Ag: NEGATIVE
Immature Grans (Abs): 0.1 10*3/uL (ref 0.0–0.1)
Immature Granulocytes: 1 %
Lymphocytes Absolute: 2.3 10*3/uL (ref 0.7–3.1)
Lymphs: 16 %
MCH: 29.2 pg (ref 26.6–33.0)
MCHC: 33.9 g/dL (ref 31.5–35.7)
MCV: 86 fL (ref 79–97)
Monocytes Absolute: 0.8 10*3/uL (ref 0.1–0.9)
Monocytes: 5 %
Neutrophils Absolute: 10.9 10*3/uL — ABNORMAL HIGH (ref 1.4–7.0)
Neutrophils: 74 %
Platelets: 314 10*3/uL (ref 150–450)
RBC: 4.52 x10E6/uL (ref 3.77–5.28)
RDW: 13.1 % (ref 11.7–15.4)
RPR Ser Ql: NONREACTIVE
Rh Factor: POSITIVE
Rubella Antibodies, IGG: 1.81 index (ref >0.99)
WBC: 14.7 10*3/uL — ABNORMAL HIGH (ref 3.4–10.8)

## 2019-07-10 LAB — INTEGRATED 1
Crown Rump Length: 53.6 mm
Gest. Age on Collection Date: 11.9 weeks
Maternal Age at EDD: 35.5 yr
Nuchal Translucency (NT): 1.3 mm
Number of Fetuses: 1
PAPP-A Value: 243.6 ng/mL
Weight: 200 [lb_av]

## 2019-07-10 LAB — CYTOLOGY - PAP
Comment: NEGATIVE
Diagnosis: NEGATIVE
High risk HPV: NEGATIVE

## 2019-07-10 LAB — HGB FRACTIONATION CASCADE
Hgb A2: 2.6 % (ref 1.8–3.2)
Hgb A: 97.1 % (ref 96.4–98.8)
Hgb F: 0.3 % (ref 0.0–2.0)
Hgb S: 0 %

## 2019-07-10 LAB — URINE CULTURE: Organism ID, Bacteria: NO GROWTH

## 2019-07-10 LAB — GC/CHLAMYDIA PROBE AMP
Chlamydia trachomatis, NAA: NEGATIVE
Neisseria Gonorrhoeae by PCR: NEGATIVE

## 2019-07-10 LAB — HEPATITIS C ANTIBODY: Hep C Virus Ab: 0.1 s/co ratio (ref 0.0–0.9)

## 2019-08-19 ENCOUNTER — Ambulatory Visit (INDEPENDENT_AMBULATORY_CARE_PROVIDER_SITE_OTHER): Payer: BC Managed Care – PPO

## 2019-08-19 ENCOUNTER — Ambulatory Visit (INDEPENDENT_AMBULATORY_CARE_PROVIDER_SITE_OTHER): Payer: BC Managed Care – PPO | Admitting: Advanced Practice Midwife

## 2019-08-19 VITALS — BP 128/81 | HR 82 | Wt 206.0 lb

## 2019-08-19 DIAGNOSIS — Z3A18 18 weeks gestation of pregnancy: Secondary | ICD-10-CM

## 2019-08-19 DIAGNOSIS — Z1379 Encounter for other screening for genetic and chromosomal anomalies: Secondary | ICD-10-CM

## 2019-08-19 DIAGNOSIS — Z3401 Encounter for supervision of normal first pregnancy, first trimester: Secondary | ICD-10-CM | POA: Diagnosis not present

## 2019-08-19 DIAGNOSIS — O09522 Supervision of elderly multigravida, second trimester: Secondary | ICD-10-CM

## 2019-08-19 DIAGNOSIS — Z1389 Encounter for screening for other disorder: Secondary | ICD-10-CM

## 2019-08-19 DIAGNOSIS — Z363 Encounter for antenatal screening for malformations: Secondary | ICD-10-CM | POA: Diagnosis not present

## 2019-08-19 DIAGNOSIS — O99322 Drug use complicating pregnancy, second trimester: Secondary | ICD-10-CM

## 2019-08-19 DIAGNOSIS — Z3482 Encounter for supervision of other normal pregnancy, second trimester: Secondary | ICD-10-CM

## 2019-08-19 DIAGNOSIS — Z331 Pregnant state, incidental: Secondary | ICD-10-CM

## 2019-08-19 DIAGNOSIS — F1490 Cocaine use, unspecified, uncomplicated: Secondary | ICD-10-CM

## 2019-08-19 DIAGNOSIS — Z3402 Encounter for supervision of normal first pregnancy, second trimester: Secondary | ICD-10-CM

## 2019-08-19 LAB — POCT URINALYSIS DIPSTICK OB
Blood, UA: NEGATIVE
Glucose, UA: NEGATIVE
Ketones, UA: NEGATIVE
Leukocytes, UA: NEGATIVE
Nitrite, UA: NEGATIVE
POC,PROTEIN,UA: NEGATIVE

## 2019-08-19 NOTE — Progress Notes (Signed)
[place] 18 wks,breech,right lateral placenta gr 0,normal ovaries,cx 3 cm,fhr 169 bpm,svp of fluid 5 cm,EFW 213 g 37%,limited view of spine because of fetal position,please have pt come back for additional images,no obvious abnormalities

## 2019-08-19 NOTE — Progress Notes (Signed)
LOW-RISK PREGNANCY VISIT Patient name: Whitney Vasquez MRN [id number]  Date of birth: [birth year] Chief Complaint:   Routine Prenatal Visit  History of Present Illness:   Whitney Vasquez is a 34 y.o. G1P0 female at [redacted]w[redacted]d with an Estimated Date of Delivery: 01/20/20 being seen today for ongoing management of a low-risk pregnancy.   Today she reports no complaints; stated that she is no longer using cocaine. Contractions: Not present. Vag. Bleeding: None.  Movement: Present. denies leaking of fluid. Review of Systems:   Pertinent items are noted in HPI Denies abnormal vaginal discharge w/ itching/odor/irritation, headaches, visual changes, shortness of breath, chest pain, abdominal pain, severe nausea/vomiting, or problems with urination or bowel movements unless otherwise stated above. Pertinent History Reviewed:  Reviewed past medical,surgical, social, obstetrical and family history.  Reviewed problem list, medications and allergies. Physical Assessment:   Vitals:   08/19/19 0955  BP: 128/81  Pulse: 82  Weight: 206 lb (93.4 kg)  Body mass index is 35.36 kg/m.        Physical Examination:   General appearance: Well appearing, and in no distress  Mental status: Alert, oriented to person, place, and time  Skin: Warm & dry  Cardiovascular: Normal heart rate noted  Respiratory: Normal respiratory effort, no distress  Abdomen: Soft, gravid, nontender  Pelvic: Cervical exam deferred         Extremities: Edema: None  Fetal Status: Fetal Heart Rate (bpm): 169 u/s   Movement: Present    Anatomy U/S: [place] 18 wks,breech,right lateral placenta gr 0,normal ovaries,cx 3 cm,fhr 169 bpm,svp of fluid 5 cm,EFW 213 g 37%,limited view of spine because of fetal position,please have pt come back for additional images,no obvious abnormalities    Results for orders placed or performed in visit on 08/19/19 (from the past 24 hour(s))  POC Urinalysis Dipstick OB   Collection Time: 08/19/19  9:56 AM    Result Value Ref Range   Color, UA     Clarity, UA     Glucose, UA Negative Negative   Bilirubin, UA     Ketones, UA neg    Spec Grav, UA     Blood, UA neg    pH, UA     POC,PROTEIN,UA Negative Negative, Trace, Small (1+), Moderate (2+), Large (3+), 4+   Urobilinogen, UA     Nitrite, UA neg    Leukocytes, UA Negative Negative   Appearance     Odor      Assessment & Plan:  1) Low-risk pregnancy G1P0 at [redacted]w[redacted]d with an Estimated Date of Delivery: 01/20/20   2) Cocaine use, states she has stopped- effects reviewed and precautions given; repeat today  3) Anatomy U/S with limited spine views, will repeat with next visit   Meds: No orders of the defined types were placed in this encounter.  Labs/procedures today: UDS, 2nd IT  Plan:  Continue routine obstetrical care   Reviewed: Preterm labor symptoms and general obstetric precautions including but not limited to vaginal bleeding, contractions, leaking of fluid and fetal movement were reviewed in detail with the patient.  All questions were answered. Has home bp cuff. Check bp weekly, let us know if >140/90.   Follow-up: Return in about 4 weeks (around 09/16/2019) for LROB, in person, [place]: OB F/U spine views.  Orders Placed This Encounter  Procedures  . US OB Follow Up  . INTEGRATED 2  . Pain Management Screening Profile (10S)  . POC Urinalysis Dipstick OB   Arabella Merles [facility] 08/19/2019  10:54 AM

## 2019-08-19 NOTE — Patient Instructions (Signed)
Whitney Vasquez, I greatly value your feedback.  If you receive a survey following your visit with [place] today, we appreciate you taking the time to fill it out.  Thanks, Whitney Vasquez CNM  [facility] ([address] [place], [place] [address]) Entrance C, located off of [address] valet parking  Go to [email or web address] to register for FREE online childbirth classes  [place] Pediatricians/Family Doctors:  [facility] [phone]            [facility] [phone]                 [facility] [phone] (usually not accepting new patients unless you have family there already, you are always welcome to call and ask)       [facility] [phone]       [facility] Pediatricians/Family Doctors:   [facility]: [phone]  [facility]: [phone]  [facility]: [phone]  [facility] Doctors:   [facility]: [phone]   [facility]: [phone]  [facility] Doctors:  [facility]: [phone]    Home Blood Pressure Monitoring for Patients   Your provider has recommended that you check your blood pressure (BP) at least once a week at home. If you do not have a blood pressure cuff at home, one will be provided for you. Contact your provider if you have not received your monitor within 1 week.   Helpful Tips for Accurate Home Blood Pressure Checks  . Don't smoke, exercise, or drink caffeine 30 minutes before checking your BP . Use the restroom before checking your BP (a full bladder can raise your pressure) . Relax in a comfortable upright chair . Feet on the ground . Left arm resting comfortably on a flat surface at the level of your heart . Legs uncrossed . Back supported . Sit quietly and don't talk . Place the cuff on your bare arm . Adjust snuggly, so that only  two fingertips can fit between your skin and the top of the cuff . Check 2 readings separated by at least one minute . Keep a log of your BP readings . For a visual, please reference this diagram: http://ccnc.care/bpdiagram  Provider Name: [facility]     Phone: [phone]  Zone 1: ALL CLEAR  Continue to monitor your symptoms:  . BP reading is less than 140 (top number) or less than 90 (bottom number)  . No right upper stomach pain . No headaches or seeing spots . No feeling nauseated or throwing up . No swelling in face and hands  Zone 2: CAUTION Call your doctor's office for any of the following:  . BP reading is greater than 140 (top number) or greater than 90 (bottom number)  . Stomach pain under your ribs in the middle or right side . Headaches or seeing spots . Feeling nauseated or throwing up . Swelling in face and hands  Zone 3: EMERGENCY  Seek immediate medical care if you have any of the following:  . BP reading is greater than160 (top number) or greater than 110 (bottom number) . Severe headaches not improving with Tylenol . Serious difficulty catching your breath . Any worsening symptoms from Zone 2     Second Trimester of Pregnancy The second trimester is from week 14 through week 27 (months 4 through 6). The second trimester is often a time when you feel your best. Your  body has adjusted to being pregnant, and you begin to feel better physically. Usually, morning sickness has lessened or quit completely, you may have more energy, and you may have an increase in appetite. The second trimester is also a time when the fetus is growing rapidly. At the end of the sixth month, the fetus is about 9 inches long and weighs about 1 pounds. You will likely begin to feel the baby move (quickening) between 16 and 20 weeks of pregnancy. Body changes during your second trimester Your body continues to go through many changes during your second trimester. The changes vary  from woman to woman.  Your weight will continue to increase. You will notice your lower abdomen bulging out.  You may begin to get stretch marks on your hips, abdomen, and breasts.  You may develop headaches that can be relieved by medicines. The medicines should be approved by your health care provider.  You may urinate more often because the fetus is pressing on your bladder.  You may develop or continue to have heartburn as a result of your pregnancy.  You may develop constipation because certain hormones are causing the muscles that push waste through your intestines to slow down.  You may develop hemorrhoids or swollen, bulging veins (varicose veins).  You may have back pain. This is caused by: ? Weight gain. ? Pregnancy hormones that are relaxing the joints in your pelvis. ? A shift in weight and the muscles that support your balance.  Your breasts will continue to grow and they will continue to become tender.  Your gums may bleed and may be sensitive to brushing and flossing.  Dark spots or blotches (chloasma, mask of pregnancy) may develop on your face. This will likely fade after the baby is born.  A dark line from your belly button to the pubic area (linea nigra) may appear. This will likely fade after the baby is born.  You may have changes in your hair. These can include thickening of your hair, rapid growth, and changes in texture. Some women also have hair loss during or after pregnancy, or hair that feels dry or thin. Your hair will most likely return to normal after your baby is born.  What to expect at prenatal visits During a routine prenatal visit:  You will be weighed to make sure you and the fetus are growing normally.  Your blood pressure will be taken.  Your abdomen will be measured to track your baby's growth.  The fetal heartbeat will be listened to.  Any test results from the previous visit will be discussed.  Your health care provider may ask  you:  How you are feeling.  If you are feeling the baby move.  If you have had any abnormal symptoms, such as leaking fluid, bleeding, severe headaches, or abdominal cramping.  If you are using any tobacco products, including cigarettes, chewing tobacco, and electronic cigarettes.  If you have any questions.  Other tests that may be performed during your second trimester include:  Blood tests that check for: ? Low iron levels (anemia). ? High blood sugar that affects pregnant women (gestational diabetes) between 78 and 28 weeks. ? Rh antibodies. This is to check for a protein on red blood cells (Rh factor).  Urine tests to check for infections, diabetes, or protein in the urine.  An ultrasound to confirm the proper growth and development of the baby.  An amniocentesis to check for possible genetic problems.  Fetal screens for  spina bifida and Down syndrome.  HIV (human immunodeficiency virus) testing. Routine prenatal testing includes screening for HIV, unless you choose not to have this test.  Follow these instructions at home: Medicines  Follow your health care provider's instructions regarding medicine use. Specific medicines may be either safe or unsafe to take during pregnancy.  Take a prenatal vitamin that contains at least 600 micrograms (mcg) of folic acid.  If you develop constipation, try taking a stool softener if your health care provider approves. Eating and drinking  Eat a balanced diet that includes fresh fruits and vegetables, whole grains, good sources of protein such as meat, eggs, or tofu, and low-fat dairy. Your health care provider will help you determine the amount of weight gain that is right for you.  Avoid raw meat and uncooked cheese. These carry germs that can cause birth defects in the baby.  If you have low calcium intake from food, talk to your health care provider about whether you should take a daily calcium supplement.  Limit foods that  are high in fat and processed sugars, such as fried and sweet foods.  To prevent constipation: ? Drink enough fluid to keep your urine clear or pale yellow. ? Eat foods that are high in fiber, such as fresh fruits and vegetables, whole grains, and beans. Activity  Exercise only as directed by your health care provider. Most women can continue their usual exercise routine during pregnancy. Try to exercise for 30 minutes at least 5 days a week. Stop exercising if you experience uterine contractions.  Avoid heavy lifting, wear low heel shoes, and practice good posture.  A sexual relationship may be continued unless your health care provider directs you otherwise. Relieving pain and discomfort  Wear a good support bra to prevent discomfort from breast tenderness.  Take warm sitz baths to soothe any pain or discomfort caused by hemorrhoids. Use hemorrhoid cream if your health care provider approves.  Rest with your legs elevated if you have leg cramps or low back pain.  If you develop varicose veins, wear support hose. Elevate your feet for 15 minutes, 3-4 times a day. Limit salt in your diet. Prenatal Care  Write down your questions. Take them to your prenatal visits.  Keep all your prenatal visits as told by your health care provider. This is important. Safety  Wear your seat belt at all times when driving.  Make a list of emergency phone numbers, including numbers for family, friends, the hospital, and police and fire departments. General instructions  Ask your health care provider for a referral to a local prenatal education class. Begin classes no later than the beginning of month 6 of your pregnancy.  Ask for help if you have counseling or nutritional needs during pregnancy. Your health care provider can offer advice or refer you to specialists for help with various needs.  Do not use hot tubs, steam rooms, or saunas.  Do not douche or use tampons or scented sanitary  pads.  Do not cross your legs for long periods of time.  Avoid cat litter boxes and soil used by cats. These carry germs that can cause birth defects in the baby and possibly loss of the fetus by miscarriage or stillbirth.  Avoid all smoking, herbs, alcohol, and unprescribed drugs. Chemicals in these products can affect the formation and growth of the baby.  Do not use any products that contain nicotine or tobacco, such as cigarettes and e-cigarettes. If you need help quitting, ask  your health care provider.  Visit your dentist if you have not gone yet during your pregnancy. Use a soft toothbrush to brush your teeth and be gentle when you floss. Contact a health care provider if:  You have dizziness.  You have mild pelvic cramps, pelvic pressure, or nagging pain in the abdominal area.  You have persistent nausea, vomiting, or diarrhea.  You have a bad smelling vaginal discharge.  You have pain when you urinate. Get help right away if:  You have a fever.  You are leaking fluid from your vagina.  You have spotting or bleeding from your vagina.  You have severe abdominal cramping or pain.  You have rapid weight gain or weight loss.  You have shortness of breath with chest pain.  You notice sudden or extreme swelling of your face, hands, ankles, feet, or legs.  You have not felt your baby move in over an hour.  You have severe headaches that do not go away when you take medicine.  You have vision changes. Summary  The second trimester is from week 14 through week 27 (months 4 through 6). It is also a time when the fetus is growing rapidly.  Your body goes through many changes during pregnancy. The changes vary from woman to woman.  Avoid all smoking, herbs, alcohol, and unprescribed drugs. These chemicals affect the formation and growth your baby.  Do not use any tobacco products, such as cigarettes, chewing tobacco, and e-cigarettes. If you need help quitting, ask your  health care provider.  Contact your health care provider if you have any questions. Keep all prenatal visits as told by your health care provider. This is important. This information is not intended to replace advice given to you by your health care provider. Make sure you discuss any questions you have with your health care provider. Document Released: 03/20/2001 Document Revised: 09/01/2015 Document Reviewed: 05/27/2012 Elsevier Interactive Patient Education  2017 [employer]  PROTECT YOURSELF & YOUR BABY FROM THE FLU! Because you are pregnant, we at [facility], along with the Centers for Disease Control (CDC), recommend that you receive the flu vaccine to protect yourself and your baby from the flu. The flu is more likely to cause severe illness in pregnant women than in women of reproductive age who are not pregnant. Changes in the immune system, heart, and lungs during pregnancy make pregnant women (and women up to two weeks postpartum) more prone to severe illness from flu, including illness resulting in hospitalization. Flu also may be harmful for a pregnant woman's developing baby. A common flu symptom is fever, which may be associated with neural tube defects and other adverse outcomes for a developing baby. Getting vaccinated can also help protect a baby after birth from flu. (Mom passes antibodies onto the developing baby during her pregnancy.)  A Flu Vaccine is the Best Protection Against Flu Getting a flu vaccine is the first and most important step in protecting against flu. Pregnant women should get a flu shot and not the live attenuated influenza vaccine (LAIV), also known as nasal spray flu vaccine. Flu vaccines given during pregnancy help protect both the mother and her baby from flu. Vaccination has been shown to reduce the risk of flu-associated acute respiratory infection in pregnant women by up to one-half. A 2018 study showed that getting a flu shot reduced a pregnant woman's  risk of being hospitalized with flu by an average of 40 percent. Pregnant women who get a flu vaccine  are also helping to protect their babies from flu illness for the first several months after their birth, when they are too young to get vaccinated.   A Long Record of Safety for Flu Shots in Pregnant Women Flu shots have been given to millions of pregnant women over many years with a good safety record. There is a lot of evidence that flu vaccines can be given safely during pregnancy; though these data are limited for the first trimester. The CDC recommends that pregnant women get vaccinated during any trimester of their pregnancy. It is very important for pregnant women to get the flu shot.   Other Preventive Actions In addition to getting a flu shot, pregnant women should take the same everyday preventive actions the CDC recommends of everyone, including covering coughs, washing hands often, and avoiding people who are sick.  Symptoms and Treatment If you get sick with flu symptoms call your doctor right away. There are antiviral drugs that can treat flu illness and prevent serious flu complications. The CDC recommends prompt treatment for people who have influenza infection or suspected influenza infection and who are at high risk of serious flu complications, such as people with asthma, diabetes (including gestational diabetes), or heart disease. Early treatment of influenza in hospitalized pregnant women has been shown to reduce the length of the hospital stay.  Symptoms Flu symptoms include fever, cough, sore throat, runny or stuffy nose, body aches, headache, chills and fatigue. Some people may also have vomiting and diarrhea. People may be infected with the flu and have respiratory symptoms without a fever.  Early Treatment is Important for Pregnant Women Treatment should begin as soon as possible because antiviral drugs work best when started early (within 48 hours after symptoms  start). Antiviral drugs can make your flu illness milder and make you feel better faster. They may also prevent serious health problems that can result from flu illness. Oral oseltamivir (Tamiflu) is the preferred treatment for pregnant women because it has the most studies available to suggest that it is safe and beneficial. Antiviral drugs require a prescription from your provider. Having a fever caused by flu infection or other infections early in pregnancy may be linked to birth defects in a baby. In addition to taking antiviral drugs, pregnant women who get a fever should treat their fever with Tylenol (acetaminophen) and contact their provider immediately.  When to [place] If you are pregnant and have any of these signs, seek care immediately:  Difficulty breathing or shortness of breath  Pain or pressure in the chest or abdomen  Sudden dizziness  Confusion  Severe or persistent vomiting  High fever that is not responding to Tylenol (or store brand equivalent)  Decreased or no movement of your baby  [email or web address]

## 2019-08-21 LAB — INTEGRATED 2
AFP MoM: 0.74
Alpha-Fetoprotein: 27.2 ng/mL
Crown Rump Length: 53.6 mm
DIA MoM: 1.39
DIA Value: 182.7 pg/mL
Estriol, Unconjugated: 1.45 ng/mL
Gest. Age on Collection Date: 11.9 weeks
Gestational Age: 17.9 weeks
Maternal Age at EDD: 35.5 yr
Nuchal Translucency (NT): 1.3 mm
Nuchal Translucency MoM: 1.08
Number of Fetuses: 1
PAPP-A MoM: 0.44
PAPP-A Value: 243.6 ng/mL
Test Results:: NEGATIVE
Weight: 200 [lb_av]
Weight: 200 [lb_av]
hCG MoM: 1.44
hCG Value: 30.1 IU/mL
uE3 MoM: 1.14

## 2019-08-21 LAB — PMP SCREEN PROFILE (10S), URINE
Amphetamine Scrn, Ur: NEGATIVE ng/mL
BARBITURATE SCREEN URINE: NEGATIVE ng/mL
BENZODIAZEPINE SCREEN, URINE: NEGATIVE ng/mL
CANNABINOIDS UR QL SCN: NEGATIVE ng/mL
Cocaine (Metab) Scrn, Ur: POSITIVE ng/mL — AB
Creatinine(Crt), U: 83.4 mg/dL (ref 20.0–300.0)
Methadone Screen, Urine: NEGATIVE ng/mL
OXYCODONE+OXYMORPHONE UR QL SCN: NEGATIVE ng/mL
Opiate Scrn, Ur: NEGATIVE ng/mL
Ph of Urine: 7.1 (ref 4.5–8.9)
Phencyclidine Qn, Ur: NEGATIVE ng/mL
Propoxyphene Scrn, Ur: NEGATIVE ng/mL

## 2019-09-11 ENCOUNTER — Encounter: Payer: BC Managed Care – PPO | Admitting: Obstetrics & Gynecology

## 2019-09-11 ENCOUNTER — Other Ambulatory Visit: Payer: BC Managed Care – PPO

## 2019-09-24 ENCOUNTER — Other Ambulatory Visit: Payer: Self-pay | Admitting: Advanced Practice Midwife

## 2019-09-24 DIAGNOSIS — Z331 Pregnant state, incidental: Secondary | ICD-10-CM

## 2019-09-24 DIAGNOSIS — IMO0002 Reserved for concepts with insufficient information to code with codable children: Secondary | ICD-10-CM

## 2019-09-28 ENCOUNTER — Ambulatory Visit (INDEPENDENT_AMBULATORY_CARE_PROVIDER_SITE_OTHER): Payer: BC Managed Care – PPO | Admitting: Obstetrics & Gynecology

## 2019-09-28 ENCOUNTER — Encounter: Payer: Self-pay | Admitting: Obstetrics & Gynecology

## 2019-09-28 ENCOUNTER — Ambulatory Visit (INDEPENDENT_AMBULATORY_CARE_PROVIDER_SITE_OTHER): Payer: BC Managed Care – PPO

## 2019-09-28 VITALS — BP 133/80 | HR 101 | Wt 209.0 lb

## 2019-09-28 DIAGNOSIS — Z331 Pregnant state, incidental: Secondary | ICD-10-CM | POA: Diagnosis not present

## 2019-09-28 DIAGNOSIS — Z3402 Encounter for supervision of normal first pregnancy, second trimester: Secondary | ICD-10-CM

## 2019-09-28 DIAGNOSIS — Z3A23 23 weeks gestation of pregnancy: Secondary | ICD-10-CM | POA: Diagnosis not present

## 2019-09-28 DIAGNOSIS — IMO0002 Reserved for concepts with insufficient information to code with codable children: Secondary | ICD-10-CM

## 2019-09-28 DIAGNOSIS — Z87898 Personal history of other specified conditions: Secondary | ICD-10-CM

## 2019-09-28 DIAGNOSIS — O09512 Supervision of elderly primigravida, second trimester: Secondary | ICD-10-CM

## 2019-09-28 DIAGNOSIS — Z1389 Encounter for screening for other disorder: Secondary | ICD-10-CM

## 2019-09-28 DIAGNOSIS — Z0489 Encounter for examination and observation for other specified reasons: Secondary | ICD-10-CM

## 2019-09-28 DIAGNOSIS — F1991 Other psychoactive substance use, unspecified, in remission: Secondary | ICD-10-CM

## 2019-09-28 LAB — POCT URINALYSIS DIPSTICK OB
Blood, UA: NEGATIVE
Glucose, UA: NEGATIVE
Ketones, UA: NEGATIVE
Leukocytes, UA: NEGATIVE
Nitrite, UA: NEGATIVE
POC,PROTEIN,UA: NEGATIVE

## 2019-09-28 NOTE — Progress Notes (Signed)
LOW-RISK PREGNANCY VISIT Patient name: Whitney Vasquez MRN [id number]  Date of birth: Sep 16, 1985 Chief Complaint:   Routine Prenatal Visit ([place] today)  History of Present Illness:   Whitney Vasquez is a 34 y.o. G1P0 female at [redacted]w[redacted]d with an Estimated Date of Delivery: 01/20/20 being seen today for ongoing management of a low-risk pregnancy.  Depression screen [facility] 2/9 07/08/2019 05/27/2019  Decreased Interest 0 0  Down, Depressed, Hopeless 0 0  PHQ - 2 Score 0 0  Altered sleeping 1 -  Tired, decreased energy 1 -  Change in appetite 0 -  Feeling bad or failure about yourself  0 -  Trouble concentrating 0 -  Moving slowly or fidgety/restless 0 -  Suicidal thoughts 0 -  PHQ-9 Score 2 -  Difficult doing work/chores Not difficult at all -    Today she reports no complaints. Contractions: Not present. Vag. Bleeding: None.  Movement: Present. denies leaking of fluid. Review of Systems:   Pertinent items are noted in HPI Denies abnormal vaginal discharge w/ itching/odor/irritation, headaches, visual changes, shortness of breath, chest pain, abdominal pain, severe nausea/vomiting, or problems with urination or bowel movements unless otherwise stated above. Pertinent History Reviewed:  Reviewed past medical,surgical, social, obstetrical and family history.  Reviewed problem list, medications and allergies. Physical Assessment:   Vitals:   09/28/19 1031  BP: 133/80  Pulse: (!) 101  Weight: 209 lb (94.8 kg)  Body mass index is 35.87 kg/m.        Physical Examination:   General appearance: Well appearing, and in no distress  Mental status: Alert, oriented to person, place, and time  Skin: Warm & dry  Cardiovascular: Normal heart rate noted  Respiratory: Normal respiratory effort, no distress  Abdomen: Soft, gravid, nontender  Pelvic: Cervical exam performed         Extremities: Edema: None  Fetal Status:     Movement: Present    Chaperone: n/a    Results for orders placed or  performed in visit on 09/28/19 (from the past 24 hour(s))  POC Urinalysis Dipstick OB   Collection Time: 09/28/19 10:32 AM  Result Value Ref Range   Color, UA     Clarity, UA     Glucose, UA Negative Negative   Bilirubin, UA     Ketones, UA neg    Spec Grav, UA     Blood, UA neg    pH, UA     POC,PROTEIN,UA Negative Negative, Trace, Small (1+), Moderate (2+), Large (3+), 4+   Urobilinogen, UA     Nitrite, UA neg    Leukocytes, UA Negative Negative   Appearance     Odor      Assessment & Plan:  1) Low-risk pregnancy G1P0 at [redacted]w[redacted]d with an Estimated Date of Delivery: 01/20/20   2) ,    Meds: No orders of the defined types were placed in this encounter.  Labs/procedures today: sonogram  Plan:  Continue routine obstetrical care PN2 next visit Next visit: prefers in person    Reviewed: Preterm labor symptoms and general obstetric precautions including but not limited to vaginal bleeding, contractions, leaking of fluid and fetal movement were reviewed in detail with the patient.  All questions were answered. has home bp cuff. Rx faxed to . Check bp weekly, let us know if >140/90.   Follow-up: No follow-ups on file.  Orders Placed This Encounter  Procedures  . Pain Management Screening Profile (10S)  . POC Urinalysis Dipstick OB   Whitney Vasquez  Whitney Vasquez  09/28/2019 10:47 AM

## 2019-09-28 NOTE — Progress Notes (Signed)
[place] 23+5 wks,cephalic,right lateral/anterior placenta gr 0,fhr 140 bpm,cx 2.9 cm,svp of fluid 5.8 cm,efw 611 g 36%,anatomy of the spine complete,no obvious abnormalities

## 2019-09-29 LAB — PMP SCREEN PROFILE (10S), URINE
Amphetamine Scrn, Ur: NEGATIVE ng/mL
BARBITURATE SCREEN URINE: NEGATIVE ng/mL
BENZODIAZEPINE SCREEN, URINE: NEGATIVE ng/mL
CANNABINOIDS UR QL SCN: NEGATIVE ng/mL
Cocaine (Metab) Scrn, Ur: POSITIVE ng/mL — AB
Creatinine(Crt), U: 109.7 mg/dL (ref 20.0–300.0)
Methadone Screen, Urine: NEGATIVE ng/mL
OXYCODONE+OXYMORPHONE UR QL SCN: NEGATIVE ng/mL
Opiate Scrn, Ur: NEGATIVE ng/mL
Ph of Urine: 6.7 (ref 4.5–8.9)
Phencyclidine Qn, Ur: NEGATIVE ng/mL
Propoxyphene Scrn, Ur: NEGATIVE ng/mL

## 2019-10-26 ENCOUNTER — Other Ambulatory Visit: Payer: BC Managed Care – PPO

## 2019-10-26 ENCOUNTER — Encounter: Payer: BC Managed Care – PPO | Admitting: Women's Health

## 2019-10-28 ENCOUNTER — Encounter: Payer: Self-pay | Admitting: Women's Health

## 2019-10-28 ENCOUNTER — Ambulatory Visit (INDEPENDENT_AMBULATORY_CARE_PROVIDER_SITE_OTHER): Payer: BC Managed Care – PPO | Admitting: Women's Health

## 2019-10-28 ENCOUNTER — Other Ambulatory Visit: Payer: BC Managed Care – PPO

## 2019-10-28 ENCOUNTER — Other Ambulatory Visit: Payer: Self-pay

## 2019-10-28 VITALS — BP 136/81 | HR 107 | Wt 209.0 lb

## 2019-10-28 DIAGNOSIS — Z3403 Encounter for supervision of normal first pregnancy, third trimester: Secondary | ICD-10-CM

## 2019-10-28 DIAGNOSIS — F1490 Cocaine use, unspecified, uncomplicated: Secondary | ICD-10-CM

## 2019-10-28 DIAGNOSIS — O99323 Drug use complicating pregnancy, third trimester: Secondary | ICD-10-CM

## 2019-10-28 DIAGNOSIS — Z3A28 28 weeks gestation of pregnancy: Secondary | ICD-10-CM

## 2019-10-28 DIAGNOSIS — Z131 Encounter for screening for diabetes mellitus: Secondary | ICD-10-CM

## 2019-10-28 LAB — POCT URINALYSIS DIPSTICK OB
Blood, UA: NEGATIVE
Glucose, UA: NEGATIVE
Ketones, UA: NEGATIVE
Leukocytes, UA: NEGATIVE
Nitrite, UA: NEGATIVE

## 2019-10-28 NOTE — Patient Instructions (Signed)
Fransisco Beau, I greatly value your feedback.  If you receive a survey following your visit with [place] today, we appreciate you taking the time to fill it out.  Thanks, Joellyn Haff, CNM, WHNP-BC   [facility] ([address] [place], [place] [address]) Entrance C, located off of [address] valet parking  Go to [email or web address] to register for FREE online childbirth classes   Call the office [phone]) or go to [facility] if:  You begin to have strong, frequent contractions  Your water breaks.  Sometimes it is a big gush of fluid, sometimes it is just a trickle that keeps getting your panties wet or running down your legs  You have vaginal bleeding.  It is normal to have a small amount of spotting if your cervix was checked.   You don't feel your baby moving like normal.  If you don't, get you something to eat and drink and lay down and focus on feeling your baby move.  You should feel at least 10 movements in 2 hours.  If you don't, you should call the office or go to [place] [facility].    Tdap Vaccine  It is recommended that you get the Tdap vaccine during the third trimester of EACH pregnancy to help protect your baby from getting pertussis (whooping cough)  27-36 weeks is the BEST time to do this so that you can pass the protection on to your baby. During pregnancy is better than after pregnancy, but if you are unable to get it during pregnancy it will be offered at the hospital.   You can get this vaccine with [place], at the health department, your family doctor, or some local pharmacies  Everyone who will be around your baby should also be up-to-date on their vaccines before the baby comes. Adults (who are not pregnant) only need 1 dose of Tdap during adulthood.   [place] Pediatricians/Family Doctors:  [facility] [phone]            [facility] [phone]                 [facility]  [phone] (usually not accepting new patients unless you have family there already, you are always welcome to call and ask)       [facility] Department [phone]       [facility] Pediatricians/Family Doctors:   [facility]: [phone]  [facility]: [phone]  [facility]: [phone]  [place] Doctors:   [facility]: [phone]   [facility]: [phone]  [facility] Doctors:  [facility]: [phone]   Home Blood Pressure Monitoring for Patients   Your provider has recommended that you check your blood pressure (BP) at least once a week at home. If you do not have a blood pressure cuff at home, one will be provided for you. Contact your provider if you have not received your monitor within 1 week.   Helpful Tips for Accurate Home Blood Pressure Checks   Don't smoke, exercise, or drink caffeine 30 minutes before checking your BP  Use the restroom before checking your BP (a full bladder can raise your pressure)  Relax in a comfortable upright chair  Feet on the ground  Left arm resting comfortably on a flat surface at the level of your heart  Legs uncrossed  Back supported  Sit quietly and don't talk  Place the cuff on your bare  arm  Adjust snuggly, so that only two fingertips can fit between your skin and the top of the cuff  Check 2 readings separated by at least one minute  Keep a log of your BP readings  For a visual, please reference this diagram: http://ccnc.care/bpdiagram  Provider Name: [facility]     Phone: [phone]  Zone 1: ALL CLEAR  Continue to monitor your symptoms:   BP reading is less than 140 (top number) or less than 90 (bottom number)   No right upper stomach pain  No headaches or seeing spots  No feeling nauseated or throwing up  No swelling in face and hands  Zone 2: CAUTION Call your  doctor's office for any of the following:   BP reading is greater than 140 (top number) or greater than 90 (bottom number)   Stomach pain under your ribs in the middle or right side  Headaches or seeing spots  Feeling nauseated or throwing up  Swelling in face and hands  Zone 3: EMERGENCY  Seek immediate medical care if you have any of the following:   BP reading is greater than160 (top number) or greater than 110 (bottom number)  Severe headaches not improving with Tylenol  Serious difficulty catching your breath  Any worsening symptoms from Zone 2   Third Trimester of Pregnancy The third trimester is from week 29 through week 42, months 7 through 9. The third trimester is a time when the fetus is growing rapidly. At the end of the ninth month, the fetus is about 20 inches in length and weighs 6-10 pounds.  BODY CHANGES Your body goes through many changes during pregnancy. The changes vary from woman to woman.   Your weight will continue to increase. You can expect to gain 25-35 pounds (11-16 kg) by the end of the pregnancy.  You may begin to get stretch marks on your hips, abdomen, and breasts.  You may urinate more often because the fetus is moving lower into your pelvis and pressing on your bladder.  You may develop or continue to have heartburn as a result of your pregnancy.  You may develop constipation because certain hormones are causing the muscles that push waste through your intestines to slow down.  You may develop hemorrhoids or swollen, bulging veins (varicose veins).  You may have pelvic pain because of the weight gain and pregnancy hormones relaxing your joints between the bones in your pelvis. Backaches may result from overexertion of the muscles supporting your posture.  You may have changes in your hair. These can include thickening of your hair, rapid growth, and changes in texture. Some women also have hair loss during or after pregnancy, or hair that  feels dry or thin. Your hair will most likely return to normal after your baby is born.  Your breasts will continue to grow and be tender. A yellow discharge may leak from your breasts called colostrum.  Your belly button may stick out.  You may feel short of breath because of your expanding uterus.  You may notice the fetus "dropping," or moving lower in your abdomen.  You may have a bloody mucus discharge. This usually occurs a few days to a week before labor begins.  Your cervix becomes thin and soft (effaced) near your due date. WHAT TO EXPECT AT YOUR PRENATAL EXAMS  You will have prenatal exams every 2 weeks until week 36. Then, you will have weekly prenatal exams. During a routine prenatal visit:  You  will be weighed to make sure you and the fetus are growing normally.  Your blood pressure is taken.  Your abdomen will be measured to track your baby's growth.  The fetal heartbeat will be listened to.  Any test results from the previous visit will be discussed.  You may have a cervical check near your due date to see if you have effaced. At around 36 weeks, your caregiver will check your cervix. At the same time, your caregiver will also perform a test on the secretions of the vaginal tissue. This test is to determine if a type of bacteria, Group B streptococcus, is present. Your caregiver will explain this further. Your caregiver may ask you:  What your birth plan is.  How you are feeling.  If you are feeling the baby move.  If you have had any abnormal symptoms, such as leaking fluid, bleeding, severe headaches, or abdominal cramping.  If you have any questions. Other tests or screenings that may be performed during your third trimester include:  Blood tests that check for low iron levels (anemia).  Fetal testing to check the health, activity level, and growth of the fetus. Testing is done if you have certain medical conditions or if there are problems during the  pregnancy. FALSE LABOR You may feel small, irregular contractions that eventually go away. These are called Braxton Hicks contractions, or false labor. Contractions may last for hours, days, or even weeks before true labor sets in. If contractions come at regular intervals, intensify, or become painful, it is best to be seen by your caregiver.  SIGNS OF LABOR   Menstrual-like cramps.  Contractions that are 5 minutes apart or less.  Contractions that start on the top of the uterus and spread down to the lower abdomen and back.  A sense of increased pelvic pressure or back pain.  A watery or bloody mucus discharge that comes from the vagina. If you have any of these signs before the 37th week of pregnancy, call your caregiver right away. You need to go to the hospital to get checked immediately. HOME CARE INSTRUCTIONS   Avoid all smoking, herbs, alcohol, and unprescribed drugs. These chemicals affect the formation and growth of the baby.  Follow your caregiver's instructions regarding medicine use. There are medicines that are either safe or unsafe to take during pregnancy.  Exercise only as directed by your caregiver. Experiencing uterine cramps is a good sign to stop exercising.  Continue to eat regular, healthy meals.  Wear a good support bra for breast tenderness.  Do not use hot tubs, steam rooms, or saunas.  Wear your seat belt at all times when driving.  Avoid raw meat, uncooked cheese, cat litter boxes, and soil used by cats. These carry germs that can cause birth defects in the baby.  Take your prenatal vitamins.  Try taking a stool softener (if your caregiver approves) if you develop constipation. Eat more high-fiber foods, such as fresh vegetables or fruit and whole grains. Drink plenty of fluids to keep your urine clear or pale yellow.  Take warm sitz baths to soothe any pain or discomfort caused by hemorrhoids. Use hemorrhoid cream if your caregiver approves.  If you  develop varicose veins, wear support hose. Elevate your feet for 15 minutes, 3-4 times a day. Limit salt in your diet.  Avoid heavy lifting, wear low heal shoes, and practice good posture.  Rest a lot with your legs elevated if you have leg cramps or low back  pain.  Visit your dentist if you have not gone during your pregnancy. Use a soft toothbrush to brush your teeth and be gentle when you floss.  A sexual relationship may be continued unless your caregiver directs you otherwise.  Do not travel far distances unless it is absolutely necessary and only with the approval of your caregiver.  Take prenatal classes to understand, practice, and ask questions about the labor and delivery.  Make a trial run to the hospital.  Pack your hospital bag.  Prepare the baby's nursery.  Continue to go to all your prenatal visits as directed by your caregiver. SEEK MEDICAL CARE IF:  You are unsure if you are in labor or if your water has broken.  You have dizziness.  You have mild pelvic cramps, pelvic pressure, or nagging pain in your abdominal area.  You have persistent nausea, vomiting, or diarrhea.  You have a bad smelling vaginal discharge.  You have pain with urination. SEEK IMMEDIATE MEDICAL CARE IF:   You have a fever.  You are leaking fluid from your vagina.  You have spotting or bleeding from your vagina.  You have severe abdominal cramping or pain.  You have rapid weight loss or gain.  You have shortness of breath with chest pain.  You notice sudden or extreme swelling of your face, hands, ankles, feet, or legs.  You have not felt your baby move in over an hour.  You have severe headaches that do not go away with medicine.  You have vision changes. Document Released: 03/20/2001 Document Revised: 03/31/2013 Document Reviewed: 05/27/2012 [facility] Patient Information 2015 [place], [place]. This information is not intended to replace advice given to you by your health  care provider. Make sure you discuss any questions you have with your health care provider.

## 2019-10-28 NOTE — Progress Notes (Signed)
LOW-RISK PREGNANCY VISIT Patient name: Whitney Vasquez MRN [id number]  Date of birth: June 15, 1985 Chief Complaint:   Routine Prenatal Visit  History of Present Illness:   Whitney Vasquez is a 34 y.o. G1P0 female at 104w0d with an Estimated Date of Delivery: 01/20/20 being seen today for ongoing management of a low-risk pregnancy.  Depression screen [facility] 2/9 10/28/2019 07/08/2019 05/27/2019  Decreased Interest 0 0 0  Down, Depressed, Hopeless 0 0 0  PHQ - 2 Score 0 0 0  Altered sleeping 0 1 -  Tired, decreased energy 0 1 -  Change in appetite 0 0 -  Feeling bad or failure about yourself  0 0 -  Trouble concentrating 0 0 -  Moving slowly or fidgety/restless 0 0 -  Suicidal thoughts 0 0 -  PHQ-9 Score 0 2 -  Difficult doing work/chores Not difficult at all Not difficult at all -    Today she reports Lt wrist pain, hurt it awhile back. Teeth sensitivity. UDS +cocaine, reports she stopped 1wk ago. Declines need for referral for help right now.  Contractions: Not present. Vag. Bleeding: None.  Movement: Present. denies leaking of fluid. Review of Systems:   Pertinent items are noted in HPI Denies abnormal vaginal discharge w/ itching/odor/irritation, headaches, visual changes, shortness of breath, chest pain, abdominal pain, severe nausea/vomiting, or problems with urination or bowel movements unless otherwise stated above. Pertinent History Reviewed:  Reviewed past medical,surgical, social, obstetrical and family history.  Reviewed problem list, medications and allergies. Physical Assessment:   Vitals:   10/28/19 0947  BP: 136/81  Pulse: (!) 107  Weight: 209 lb (94.8 kg)  Body mass index is 35.87 kg/m.        Physical Examination:   General appearance: Well appearing, and in no distress  Mental status: Alert, oriented to person, place, and time  Skin: Warm & dry  Cardiovascular: Normal heart rate noted  Respiratory: Normal respiratory effort, no distress  Abdomen: Soft,  gravid, nontender  Pelvic: Cervical exam deferred         Extremities: Edema: None  Fetal Status: Fetal Heart Rate (bpm): 157 Fundal Height: 30 cm Movement: Present    Chaperone: n/a    Results for orders placed or performed in visit on 10/28/19 (from the past 24 hour(s))  POC Urinalysis Dipstick OB   Collection Time: 10/28/19  9:45 AM  Result Value Ref Range   Color, UA     Clarity, UA     Glucose, UA Negative Negative   Bilirubin, UA     Ketones, UA n    Spec Grav, UA     Blood, UA n    pH, UA     POC,PROTEIN,UA Trace Negative, Trace, Small (1+), Moderate (2+), Large (3+), 4+   Urobilinogen, UA     Nitrite, UA n    Leukocytes, UA Negative Negative   Appearance     Odor      Assessment & Plan:  1) Low-risk pregnancy G1P0 at [redacted]w[redacted]d with an Estimated Date of Delivery: 01/20/20   2) Cocaine use during pregnancy, stopped 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 Plan repeat UDS next visit. Discussed cocaine during pregnancy/potential complications including HTN/abruption/fetal death. Check bp q wk, normal. Continue checking. Reviewed s/s pre-e/HTN, reasons to seek care.    Meds: No orders of the defined types were placed in this encounter.  Labs/procedures today: pn2, wants tdap next visit  Plan:  Continue routine obstetrical care  Next visit: prefers  in person    Reviewed: Preterm labor symptoms and general obstetric precautions including but not limited to vaginal bleeding, contractions, leaking of fluid and fetal movement were reviewed in detail with the patient.  All questions were answered. Has home bp cuff.  Follow-up: Return in about 4 weeks (around 11/25/2019) for LROB, CNM, in person.  Orders Placed This Encounter  Procedures  . POC Urinalysis Dipstick OB   Cheral Marker CNM, [facility] 10/28/2019 10:53 AM

## 2019-10-29 ENCOUNTER — Encounter: Payer: Self-pay | Admitting: *Deleted

## 2019-10-29 ENCOUNTER — Encounter: Payer: Self-pay | Admitting: Women's Health

## 2019-10-29 ENCOUNTER — Other Ambulatory Visit: Payer: Self-pay | Admitting: *Deleted

## 2019-10-29 DIAGNOSIS — O0990 Supervision of high risk pregnancy, unspecified, unspecified trimester: Secondary | ICD-10-CM

## 2019-10-29 DIAGNOSIS — O24410 Gestational diabetes mellitus in pregnancy, diet controlled: Secondary | ICD-10-CM | POA: Insufficient documentation

## 2019-10-29 DIAGNOSIS — D72829 Elevated white blood cell count, unspecified: Secondary | ICD-10-CM | POA: Insufficient documentation

## 2019-10-29 LAB — CBC
Hematocrit: 36.9 % (ref 34.0–46.6)
Hemoglobin: 12 g/dL (ref 11.1–15.9)
MCH: 27.1 pg (ref 26.6–33.0)
MCHC: 32.5 g/dL (ref 31.5–35.7)
MCV: 84 fL (ref 79–97)
Platelets: 320 10*3/uL (ref 150–450)
RBC: 4.42 x10E6/uL (ref 3.77–5.28)
RDW: 13.4 % (ref 11.7–15.4)
WBC: 19.9 10*3/uL — ABNORMAL HIGH (ref 3.4–10.8)

## 2019-10-29 LAB — GLUCOSE TOLERANCE, 2 HOURS W/ 1HR
Glucose, 1 hour: 116 mg/dL (ref 65–179)
Glucose, 2 hour: 147 mg/dL (ref 65–152)
Glucose, Fasting: 92 mg/dL — ABNORMAL HIGH (ref 65–91)

## 2019-10-29 LAB — ANTIBODY SCREEN: Antibody Screen: NEGATIVE

## 2019-10-29 LAB — HIV ANTIBODY (ROUTINE TESTING W REFLEX): HIV Screen 4th Generation wRfx: NONREACTIVE

## 2019-10-29 LAB — RPR: RPR Ser Ql: NONREACTIVE

## 2019-10-29 MED ORDER — ACCU-CHEK GUIDE VI STRP: ORAL_STRIP | 12 refills | Status: DC

## 2019-10-29 MED ORDER — ACCU-CHEK SOFTCLIX LANCETS MISC: 12 refills | Status: DC

## 2019-10-29 MED ORDER — ACCU-CHEK GUIDE ME W/DEVICE KIT: 1.0000 | PACK | Freq: Four times a day (QID) | 0 refills | Status: DC

## 2019-11-12 ENCOUNTER — Other Ambulatory Visit: Payer: BC Managed Care – PPO

## 2019-11-18 ENCOUNTER — Encounter (HOSPITAL_COMMUNITY): Payer: Self-pay | Admitting: Anesthesiology

## 2019-11-18 ENCOUNTER — Encounter (HOSPITAL_COMMUNITY): Payer: Self-pay

## 2019-11-18 ENCOUNTER — Inpatient Hospital Stay (HOSPITAL_COMMUNITY)
Admission: EM | Admit: 2019-11-18 | Discharge: 2019-11-20 | DRG: 805 | Disposition: A | Discharge status: Home / Self Care | Payer: BC Managed Care – PPO | Attending: Family Medicine | Admitting: Family Medicine

## 2019-11-18 ENCOUNTER — Emergency Department (HOSPITAL_COMMUNITY): Payer: BC Managed Care – PPO

## 2019-11-18 ENCOUNTER — Other Ambulatory Visit: Payer: Self-pay

## 2019-11-18 DIAGNOSIS — F1510 Other stimulant abuse, uncomplicated: Secondary | ICD-10-CM | POA: Diagnosis not present

## 2019-11-18 DIAGNOSIS — O364XX0 Maternal care for intrauterine death, not applicable or unspecified: Secondary | ICD-10-CM | POA: Diagnosis present

## 2019-11-18 DIAGNOSIS — J45909 Unspecified asthma, uncomplicated: Secondary | ICD-10-CM | POA: Diagnosis present

## 2019-11-18 DIAGNOSIS — O24410 Gestational diabetes mellitus in pregnancy, diet controlled: Secondary | ICD-10-CM | POA: Diagnosis present

## 2019-11-18 DIAGNOSIS — O9952 Diseases of the respiratory system complicating childbirth: Secondary | ICD-10-CM | POA: Diagnosis present

## 2019-11-18 DIAGNOSIS — O99334 Smoking (tobacco) complicating childbirth: Secondary | ICD-10-CM | POA: Diagnosis present

## 2019-11-18 DIAGNOSIS — F1490 Cocaine use, unspecified, uncomplicated: Secondary | ICD-10-CM | POA: Diagnosis present

## 2019-11-18 DIAGNOSIS — O4593 Premature separation of placenta, unspecified, third trimester: Secondary | ICD-10-CM | POA: Diagnosis present

## 2019-11-18 DIAGNOSIS — F1419 Cocaine abuse with unspecified cocaine-induced disorder: Secondary | ICD-10-CM | POA: Diagnosis not present

## 2019-11-18 DIAGNOSIS — O9852 Other viral diseases complicating childbirth: Secondary | ICD-10-CM | POA: Diagnosis present

## 2019-11-18 DIAGNOSIS — O99324 Drug use complicating childbirth: Secondary | ICD-10-CM | POA: Diagnosis present

## 2019-11-18 DIAGNOSIS — Z3A31 31 weeks gestation of pregnancy: Secondary | ICD-10-CM | POA: Diagnosis not present

## 2019-11-18 DIAGNOSIS — O45023 Premature separation of placenta with disseminated intravascular coagulation, third trimester: Secondary | ICD-10-CM | POA: Diagnosis present

## 2019-11-18 DIAGNOSIS — O0990 Supervision of high risk pregnancy, unspecified, unspecified trimester: Secondary | ICD-10-CM

## 2019-11-18 DIAGNOSIS — O24420 Gestational diabetes mellitus in childbirth, diet controlled: Secondary | ICD-10-CM | POA: Diagnosis present

## 2019-11-18 DIAGNOSIS — U071 COVID-19: Secondary | ICD-10-CM | POA: Diagnosis present

## 2019-11-18 DIAGNOSIS — R001 Bradycardia, unspecified: Secondary | ICD-10-CM

## 2019-11-18 DIAGNOSIS — D65 Disseminated intravascular coagulation [defibrination syndrome]: Secondary | ICD-10-CM | POA: Diagnosis present

## 2019-11-18 DIAGNOSIS — F17210 Nicotine dependence, cigarettes, uncomplicated: Secondary | ICD-10-CM | POA: Diagnosis present

## 2019-11-18 LAB — DIC (DISSEMINATED INTRAVASCULAR COAGULATION)PANEL
D-Dimer, Quant: 9.49 ug/mL-FEU — ABNORMAL HIGH (ref 0.00–0.50)
Fibrinogen: 146 mg/dL — ABNORMAL LOW (ref 210–475)
INR: 1.1 (ref 0.8–1.2)
Platelets: 90 10*3/uL — ABNORMAL LOW (ref 150–400)
Prothrombin Time: 13.9 seconds (ref 11.4–15.2)
Smear Review: NONE SEEN
aPTT: 32 seconds (ref 24–36)

## 2019-11-18 LAB — CBC
HCT: 29.5 % — ABNORMAL LOW (ref 36.0–46.0)
HCT: 30.5 % — ABNORMAL LOW (ref 36.0–46.0)
HCT: 30.3 % — ABNORMAL LOW (ref 36.0–46.0)
Hemoglobin: 10 g/dL — ABNORMAL LOW (ref 12.0–15.0)
Hemoglobin: 10 g/dL — ABNORMAL LOW (ref 12.0–15.0)
Hemoglobin: 10 g/dL — ABNORMAL LOW (ref 12.0–15.0)
MCH: 28.1 pg (ref 26.0–34.0)
MCH: 27 pg (ref 26.0–34.0)
MCH: 27.3 pg (ref 26.0–34.0)
MCHC: 33.9 g/dL (ref 30.0–36.0)
MCHC: 32.8 g/dL (ref 30.0–36.0)
MCHC: 33 g/dL (ref 30.0–36.0)
MCV: 82.9 fL (ref 80.0–100.0)
MCV: 82.2 fL (ref 80.0–100.0)
MCV: 82.8 fL (ref 80.0–100.0)
Platelets: 162 10*3/uL (ref 150–400)
Platelets: 165 10*3/uL (ref 150–400)
Platelets: 187 10*3/uL (ref 150–400)
RBC: 3.56 MIL/uL — ABNORMAL LOW (ref 3.87–5.11)
RBC: 3.71 MIL/uL — ABNORMAL LOW (ref 3.87–5.11)
RBC: 3.66 MIL/uL — ABNORMAL LOW (ref 3.87–5.11)
RDW: 14.1 % (ref 11.5–15.5)
RDW: 14.2 % (ref 11.5–15.5)
RDW: 14.1 % (ref 11.5–15.5)
WBC: 21.9 10*3/uL — ABNORMAL HIGH (ref 4.0–10.5)
WBC: 20.3 10*3/uL — ABNORMAL HIGH (ref 4.0–10.5)
WBC: 18.3 10*3/uL — ABNORMAL HIGH (ref 4.0–10.5)
nRBC: 0 % (ref 0.0–0.2)
nRBC: 0 % (ref 0.0–0.2)
nRBC: 0 % (ref 0.0–0.2)

## 2019-11-18 LAB — RAPID URINE DRUG SCREEN, HOSP PERFORMED
Amphetamines: NOT DETECTED
Barbiturates: NOT DETECTED
Benzodiazepines: NOT DETECTED
Cocaine: POSITIVE — AB
Opiates: NOT DETECTED
Tetrahydrocannabinol: NOT DETECTED

## 2019-11-18 LAB — BASIC METABOLIC PANEL
Anion gap: 11 (ref 5–15)
BUN: 5 mg/dL — ABNORMAL LOW (ref 6–20)
CO2: 23 mmol/L (ref 22–32)
Calcium: 8.3 mg/dL — ABNORMAL LOW (ref 8.9–10.3)
Chloride: 104 mmol/L (ref 98–111)
Creatinine, Ser: 0.77 mg/dL (ref 0.44–1.00)
GFR calc Af Amer: >60 mL/min (ref >60)
GFR calc non Af Amer: >60 mL/min (ref >60)
Glucose, Bld: 99 mg/dL (ref 70–99)
Potassium: 3.1 mmol/L — ABNORMAL LOW (ref 3.5–5.1)
Sodium: 138 mmol/L (ref 135–145)

## 2019-11-18 LAB — PROTIME-INR
INR: 1.1 (ref 0.8–1.2)
INR: 1 (ref 0.8–1.2)
INR: 1.1 (ref 0.8–1.2)
Prothrombin Time: 13.5 seconds (ref 11.4–15.2)
Prothrombin Time: 13.1 seconds (ref 11.4–15.2)
Prothrombin Time: 13.3 seconds (ref 11.4–15.2)

## 2019-11-18 LAB — URINALYSIS, ROUTINE W REFLEX MICROSCOPIC
Glucose, UA: 100 mg/dL — AB
Ketones, ur: NEGATIVE mg/dL
Nitrite: NEGATIVE
Protein, ur: >300 mg/dL — AB
Specific Gravity, Urine: >1.03 — ABNORMAL HIGH (ref 1.005–1.030)
pH: 6.5 (ref 5.0–8.0)

## 2019-11-18 LAB — CBC WITH DIFFERENTIAL/PLATELET
Abs Immature Granulocytes: 0.16 10*3/uL — ABNORMAL HIGH (ref 0.00–0.07)
Basophils Absolute: 0.1 10*3/uL (ref 0.0–0.1)
Basophils Relative: 1 %
Eosinophils Absolute: 0.1 10*3/uL (ref 0.0–0.5)
Eosinophils Relative: 1 %
HCT: 37.6 % (ref 36.0–46.0)
Hemoglobin: 12.1 g/dL (ref 12.0–15.0)
Immature Granulocytes: 1 %
Lymphocytes Relative: 36 %
Lymphs Abs: 5 10*3/uL — ABNORMAL HIGH (ref 0.7–4.0)
MCH: 27.2 pg (ref 26.0–34.0)
MCHC: 32.2 g/dL (ref 30.0–36.0)
MCV: 84.5 fL (ref 80.0–100.0)
Monocytes Absolute: 1.2 10*3/uL — ABNORMAL HIGH (ref 0.1–1.0)
Monocytes Relative: 9 %
Neutro Abs: 7.3 10*3/uL (ref 1.7–7.7)
Neutrophils Relative %: 52 %
Platelets: 86 10*3/uL — ABNORMAL LOW (ref 150–400)
RBC: 4.45 MIL/uL (ref 3.87–5.11)
RDW: 14.5 % (ref 11.5–15.5)
WBC: 13.9 10*3/uL — ABNORMAL HIGH (ref 4.0–10.5)
nRBC: 0 % (ref 0.0–0.2)

## 2019-11-18 LAB — APTT
aPTT: 31 seconds (ref 24–36)
aPTT: 30 seconds (ref 24–36)
aPTT: 31 seconds (ref 24–36)

## 2019-11-18 LAB — D-DIMER, QUANTITATIVE
D-Dimer, Quant: 5.46 ug/mL-FEU — ABNORMAL HIGH (ref 0.00–0.50)
D-Dimer, Quant: 3.91 ug/mL-FEU — ABNORMAL HIGH (ref 0.00–0.50)
D-Dimer, Quant: 2.99 ug/mL-FEU — ABNORMAL HIGH (ref 0.00–0.50)

## 2019-11-18 LAB — URINALYSIS, MICROSCOPIC (REFLEX)

## 2019-11-18 LAB — PREPARE RBC (CROSSMATCH)

## 2019-11-18 LAB — FIBRINOGEN
Fibrinogen: 293 mg/dL (ref 210–475)
Fibrinogen: 288 mg/dL (ref 210–475)
Fibrinogen: 306 mg/dL (ref 210–475)

## 2019-11-18 LAB — ABO/RH: ABO/RH(D): AB POS

## 2019-11-18 LAB — SARS CORONAVIRUS 2 BY RT PCR (HOSPITAL ORDER, PERFORMED IN ~~LOC~~ HOSPITAL LAB): SARS Coronavirus 2: POSITIVE — AB

## 2019-11-18 MED ORDER — LIDOCAINE HCL (PF) 1 % IJ SOLN: 30.0000 mL | INTRAMUSCULAR | Status: DC | PRN

## 2019-11-18 MED ORDER — SODIUM CHLORIDE 0.9 % IV SOLN: Freq: Once | INTRAVENOUS | Status: AC

## 2019-11-18 MED ORDER — DIPHENHYDRAMINE HCL 50 MG/ML IJ SOLN
50.0000 mg | Freq: Once | INTRAMUSCULAR | Status: AC
  Administered 2019-11-18: 50 mg via INTRAVENOUS

## 2019-11-18 MED ORDER — FENTANYL-BUPIVACAINE-NACL 0.5-0.125-0.9 MG/250ML-% EP SOLN
12.0000 mL/h | EPIDURAL | Status: DC | PRN
  Filled 2019-11-18: qty 250

## 2019-11-18 MED ORDER — OXYTOCIN BOLUS FROM INFUSION
333.0000 mL | Freq: Once | INTRAVENOUS | Status: AC
  Administered 2019-11-19: 333 mL via INTRAVENOUS

## 2019-11-18 MED ORDER — LACTATED RINGERS IV SOLN: INTRAVENOUS | Status: DC

## 2019-11-18 MED ORDER — MISOPROSTOL 50MCG HALF TABLET
50.0000 ug | ORAL_TABLET | ORAL | Status: DC
  Administered 2019-11-18 (×3): 50 ug via ORAL
  Filled 2019-11-18 (×3): qty 1

## 2019-11-18 MED ORDER — ONDANSETRON HCL 4 MG/2ML IJ SOLN: 4.0000 mg | Freq: Four times a day (QID) | INTRAMUSCULAR | Status: DC | PRN

## 2019-11-18 MED ORDER — EPHEDRINE 5 MG/ML INJ: 10.0000 mg | INTRAVENOUS | Status: DC | PRN

## 2019-11-18 MED ORDER — ACETAMINOPHEN 325 MG PO TABS: 650.0000 mg | ORAL_TABLET | ORAL | Status: DC | PRN

## 2019-11-18 MED ORDER — FLEET ENEMA 7-19 GM/118ML RE ENEM: 1.0000 | ENEMA | Freq: Every day | RECTAL | Status: DC | PRN

## 2019-11-18 MED ORDER — MISOPROSTOL 50MCG HALF TABLET
100.0000 ug | ORAL_TABLET | ORAL | Status: DC
  Administered 2019-11-18: 50 ug via VAGINAL
  Filled 2019-11-18: qty 2

## 2019-11-18 MED ORDER — PHENYLEPHRINE 40 MCG/ML (10ML) SYRINGE FOR IV PUSH (FOR BLOOD PRESSURE SUPPORT): 80.0000 ug | PREFILLED_SYRINGE | INTRAVENOUS | Status: DC | PRN

## 2019-11-18 MED ORDER — DIPHENHYDRAMINE HCL 50 MG/ML IJ SOLN: 12.5000 mg | INTRAMUSCULAR | Status: DC | PRN

## 2019-11-18 MED ORDER — LACTATED RINGERS IV SOLN: 500.0000 mL | Freq: Once | INTRAVENOUS | Status: DC

## 2019-11-18 MED ORDER — LACTATED RINGERS IV SOLN: 500.0000 mL | INTRAVENOUS | Status: DC | PRN

## 2019-11-18 MED ORDER — OXYTOCIN-SODIUM CHLORIDE 30-0.9 UT/500ML-% IV SOLN: 2.5000 [IU]/h | INTRAVENOUS | Status: DC

## 2019-11-18 MED ORDER — SODIUM CHLORIDE 0.9% IV SOLUTION: Freq: Once | INTRAVENOUS | Status: DC

## 2019-11-18 MED ORDER — FAMOTIDINE IN NACL 20-0.9 MG/50ML-% IV SOLN: 20.0000 mg | Freq: Once | INTRAVENOUS | Status: DC

## 2019-11-18 MED ORDER — SOD CITRATE-CITRIC ACID 500-334 MG/5ML PO SOLN: 30.0000 mL | ORAL | Status: DC | PRN

## 2019-11-18 MED ORDER — PHENYLEPHRINE 40 MCG/ML (10ML) SYRINGE FOR IV PUSH (FOR BLOOD PRESSURE SUPPORT)
80.0000 ug | PREFILLED_SYRINGE | INTRAVENOUS | Status: DC | PRN
  Filled 2019-11-18: qty 10

## 2019-11-18 MED ORDER — FENTANYL CITRATE (PF) 100 MCG/2ML IJ SOLN
50.0000 ug | INTRAMUSCULAR | Status: DC | PRN
  Administered 2019-11-18 (×2): 100 ug via INTRAVENOUS
  Administered 2019-11-18: 50 ug via INTRAVENOUS
  Filled 2019-11-18 (×3): qty 2

## 2019-11-18 MED ORDER — HYDROMORPHONE HCL 1 MG/ML IJ SOLN
1.0000 mg | INTRAMUSCULAR | Status: DC | PRN
  Administered 2019-11-18 – 2019-11-19 (×4): 1 mg via INTRAVENOUS
  Filled 2019-11-18 (×2): qty 1
  Filled 2019-11-18: qty 2

## 2019-11-18 MED ORDER — EPHEDRINE 5 MG/ML INJ
10.0000 mg | INTRAVENOUS | Status: DC | PRN
  Filled 2019-11-18: qty 10

## 2019-11-18 MED ORDER — DIPHENHYDRAMINE HCL 50 MG/ML IJ SOLN
INTRAMUSCULAR | Status: AC
  Filled 2019-11-18: qty 1

## 2019-11-18 MED ORDER — FAMOTIDINE IN NACL 20-0.9 MG/50ML-% IV SOLN
INTRAVENOUS | Status: AC
  Administered 2019-11-18: 20 mg
  Filled 2019-11-18: qty 50

## 2019-11-18 NOTE — ED Notes (Signed)
Pt got out of bed to pee and went back to bed. Then she felt blood and she got up to look and red blood was coming down her leg , blood in the floor. Pt stated that was 10 minutes before getting to ER. No blood noted since episode.

## 2019-11-18 NOTE — Progress Notes (Signed)
Hives noted with itching on pt's chest and upper back.  Dr Shawnie Pons notified of reaction and orders rec.

## 2019-11-18 NOTE — ED Triage Notes (Addendum)
Pt presents to ED following "gush of blood" at home. Pt states she is 7 months pregnant. Pt denies intercourse. Pt unsure of ROM or contractions. Pt states she has not felt her baby move since 0130.

## 2019-11-18 NOTE — ED Notes (Signed)
Unable to find fetal heart tones. Dr Judd Lien notified. Doppler attempted, unsuccessful.

## 2019-11-18 NOTE — ED Notes (Signed)
Dr. Ferguson at bedside. 

## 2019-11-18 NOTE — Progress Notes (Signed)
Subjective:  Whitney Vasquez is a 34 y.o. G1 P0 female with EDC Estimated Date of Delivery: 01/20/20  at 30 and 0/[redacted] weeks gestation who is being admitted for abruptio placenta with fetal demise in utero.  Her current obstetrical history is significant for gestational DM, placental abruption associated with recurrent cocaine use. The Patient reports bleeding and contractions since 6 am.   Fetal Movement: last felt at 1 am, before going to sleep. . She awoke to vaginal bleeding and cramping, presenting to [facility] where u/s confirmation of abruption of the anterior placenta with fetal demise is confirmed . Presentation is vertex.  Amniotic fluid is present in normal amounts. There is a small area of visible separation underneath the placenta.  She acknowledges use thru the past weekend beginning Thursday, thru weekend. She previously stated she had not used since "last week"   Objective:   Vital signs in last 24 hours: Temp:  [97.8 F (36.6 C)] 97.8 F (36.6 C) (08/11 0731) Pulse Rate:  [64-67] 67 (08/11 0731) BP: (93)/(66) 93/66 (08/11 0730) SpO2:  [100 %] 100 % (08/11 0729) Weight:  [93 kg] 93 kg (08/11 0658)   General:   alert, cooperative and mild distress  Skin:   normal  HEENT:  PERRLA  Lungs:   unlabored breathing  Heart:   regular rate and rhythm  Breasts:     Abdomen:  gravid uterus, c/w dates, uterine tenderness  Pelvis:  Cervix: closed midposition, long, accessible, cephalic well applied  FHT:  0 BPM  Uterine Size: size equals dates  Presentations: cephalic  Cervix:    Dilation: Closed   Effacement: Long   Station:  -2   Consistency: firm   Position: middle   Lab Review  AB, Rh+  AFP:NML  One hour GTT: Normal    CBC    Component Value Date/Time   WBC 13.9 (H) 11/18/2019 0710   RBC 4.45 11/18/2019 0710   HGB 12.1 11/18/2019 0710   HGB 12.0 10/28/2019 0924   HCT 37.6 11/18/2019 0710   HCT 36.9 10/28/2019 0924   PLT 86 (L) 11/18/2019 0710   PLT 320  10/28/2019 0924   MCV 84.5 11/18/2019 0710   MCV 84 10/28/2019 0924   MCH 27.2 11/18/2019 0710   MCHC 32.2 11/18/2019 0710   RDW 14.5 11/18/2019 0710   RDW 13.4 10/28/2019 0924   LYMPHSABS 5.0 (H) 11/18/2019 0710   LYMPHSABS 2.3 07/08/2019 1207   MONOABS 1.2 (H) 11/18/2019 0710   EOSABS 0.1 11/18/2019 0710   EOSABS 0.5 (H) 07/08/2019 1207   BASOSABS 0.1 11/18/2019 0710   BASOSABS 0.1 07/08/2019 1207   BMET    Component Value Date/Time   NA 138 11/18/2019 0710   K 3.1 (L) 11/18/2019 0710   CL 104 11/18/2019 0710   CO2 23 11/18/2019 0710   GLUCOSE 99 11/18/2019 0710   BUN 5 (L) 11/18/2019 0710   CREATININE 0.77 11/18/2019 0710   CALCIUM 8.3 (L) 11/18/2019 0710   GFRNONAA >60 11/18/2019 0710   GFRAA >60 11/18/2019 0710     Assessment/Plan:  35 and 0/[redacted] weeks gestation. Fetal demise Placental abruption History of regular cocaine use including throughout weekend. Obstetrical history significant for cocaine use.     Risks, benefits, alternatives and possible complications have been discussed in detail with the patient.  Pre-admission, admission, and post admission procedures and expectations were discussed in detail.  All questions answered, all appropriate consents will be signed at the Hospital. Admission is planned for today.  Transfer by EMS is arranged. Patient is aware of the Baby's demise, is calm, will have support person drive to [facility] to be with her.   Patient ID: Whitney Vasquez, female   DOB: [birth year], 34 y.o.   MRN: [id number]

## 2019-11-18 NOTE — ED Notes (Signed)
Ultrasound paged

## 2019-11-18 NOTE — ED Notes (Signed)
Ultrasound here

## 2019-11-18 NOTE — Progress Notes (Signed)
Whitney Vasquez is a 34 y.o. G1P0 at [redacted]w[redacted]d admitted for IOL 2/2 IUFD in the context of +cocaine/placental abruption  Subjective: Feeling more cramping and rectal pressure  Objective: BP 97/64    Pulse 88    Temp 98.9 F (37.2 C) (Oral)    Resp 16    Ht 5\' 4"  (1.626 m)    Wt 93 kg    LMP 04/07/2019 (Exact Date)    SpO2 100%    BMI 35.19 kg/m  No intake/output data recorded.  SVE:   Dilation: 1 Effacement (%): 100 Station: 0 Exam by:: Dierdra Salameh DO  Labs: Lab Results  Component Value Date   WBC 18.3 (H) 11/18/2019   HGB 10.0 (L) 11/18/2019   HCT 30.3 (L) 11/18/2019   MCV 82.8 11/18/2019   PLT 187 11/18/2019    Assessment / Plan: 34 yo G1P0 at 31.0 EGA who is here for IOL 2/2 IUFD in the context of +cocaine/placental abruption  Labor: s/p 3 doses of cytotec. FB placed without difficulty this exam. 50 cytotec given vaginally (100 attempted, but after first placement patient requested to not attempt second pill placement) Pain Control:  Per patient request, 2nd dose of Dilaudid given. Wishes to avoid epidural DIC: moderate amount of blood on glove, blood now darker. S/p 2 units of FFP, 2 units of cryo. DIC labs reviewed with Dr. 31- ok for now. Continue q4h labs, transfuse PRN Asymptomatic COVID:  No symptoms, O2 sat fine Anticipated MOD:  vaginal  Trelyn Vanderlinde L Diontae Route DO OB Fellow, [facility] 11/18/2019, 10:39 PM

## 2019-11-18 NOTE — H&P (Signed)
Whitney Vasquez is an 34 y.o. G46P0 75w0dfemale.   Chief Complaint: IUFD HPI: Patient was awakened this morning approximately 6 AM with cramping.  She went to the bathroom was and noted bleeding.  There was a trail of blood after her.  [facility].  She has previous cocaine use reportedly last used over the weekend.  A UDS was positive for cocaine.  She was found to be in DIC with a platelet count of 86, and a fibrinogen of 146.  She was transferred here for further management.  Past Medical History:  Diagnosis Date  . Asthma    inhaler prn    Past Surgical History:  Procedure Laterality Date  . NO PAST SURGERIES      Family History  Problem Relation Age of Onset  . COPD Maternal Grandmother   . Arthritis Maternal Grandmother   . Cancer Paternal Uncle    Social History:  reports that she has been smoking cigarettes. She has been smoking about 0.25 packs per day. She has never used smokeless tobacco. She reports previous alcohol use. She reports previous drug use. Drug: Marijuana.   No Known Allergies  Medications Prior to Admission  Medication Sig Dispense Refill  . Accu-Chek Softclix Lancets lancets Use as instructed to check blood sugar 4 times daily 100 each 12  . Acetaminophen (TYLENOL PO) Take by mouth.    .Marland Kitchenalbuterol (VENTOLIN HFA) 108 (90 Base) MCG/ACT inhaler Inhale 2 puffs into the lungs every 6 (six) hours as needed for wheezing or shortness of breath. 18 g 1  . Blood Glucose Monitoring Suppl (ACCU-CHEK GUIDE ME) w/Device KIT 1 each by Does not apply route 4 (four) times daily. 1 kit 0  . Blood Pressure Monitor MISC For regular home bp monitoring during pregnancy 1 each 0  . diphenhydrAMINE HCl (BENADRYL PO) Take by mouth.    .Marland Kitchenglucose blood (ACCU-CHEK GUIDE) test strip Use as instructed to check blood sugar 4 times daily 50 each 12  . Prenatal Vit-Fe Fumarate-FA (PRENATAL VITAMIN PO) Take by mouth.        A comprehensive review of systems was negative.  Blood pressure 118/72, pulse 81, temperature 97.8 F (36.6 C), temperature source Oral, resp. rate 18, height 5' 4"  (1.626 m), weight 93 kg, last menstrual period 04/07/2019, SpO2 100 %. General appearance: alert, cooperative and appears stated age Head: Normocephalic, without obvious abnormality, atraumatic Neck: supple, symmetrical, trachea midline Lungs: Normal effort Heart: regular rate and rhythm Abdomen: Gravid, firm, tender to palpation Extremities: Homans sign is negative, no sign of DVT Skin: Skin color, texture, turgor normal. No rashes or lesions Neurologic: Grossly normal   Lab Results  Component Value Date   WBC 13.9 (H) 11/18/2019   HGB 12.1 11/18/2019   HCT 37.6 11/18/2019   MCV 84.5 11/18/2019   PLT 90 (L) 11/18/2019         ABO, Rh: AB/Positive/-- (03/31 1207)  Antibody: Negative (07/21 0924)  Rubella: 1.81 (03/31 1207)  RPR: Non Reactive (07/21 0924)  HBsAg: Negative (03/31 1207)  HIV: Non Reactive (07/21 0924)  GBS:       Assessment/Plan Principal Problem:   Fetal demise affecting delivery Active Problems:   Cocaine use   Gestational diabetes mellitus, class A1   Placental abruption in third trimester   DIC (disseminated intravascular coagulation) ([facility]  Admit to L&D Consult anesthesia, performed by Dr. FRoyce Macadamia2 IVs, placed We  attempt to correct coagulopathy by giving 2 units of FFP, 2 of cryo. Serial labs every 4 hours Packed RBCs as needed. Begin induction of labor with Cytotec 50 mcg per vagina every 4 hours Pain management as needed unlikely to be a candidate for epidural will need PCA or the like  Whitney Vasquez 11/18/2019, 10:02 AM

## 2019-11-18 NOTE — Progress Notes (Signed)
I received a consult for this pt because she is experiencing the loss of her baby.  I attempted to see the patient but she and her partner were both sleeping.  I will plan to follow up tomorrow, but if there are needs tonight, please page the chaplain on-call at [phone].  Chaplain Dyanne Carrel, Bcc Pager, [phone] 4:42 PM

## 2019-11-18 NOTE — ED Notes (Signed)
Bright red blood noted from vagina

## 2019-11-18 NOTE — ED Notes (Signed)
All pt belongings given to boyfriend beside cell phone

## 2019-11-18 NOTE — Progress Notes (Signed)
Labor Progress Note Whitney Vasquez is a 34 y.o. G1P0 at [redacted]w[redacted]d presented for fetal demise  S: Patient resting, did not disturb.   O:  BP 120/81   Pulse 82   Temp 98.2 F (36.8 C) (Oral)   Resp 16   Ht 5\' 4"  (1.626 m)   Wt 93 kg   LMP 04/07/2019 (Exact Date)   SpO2 100%   BMI 35.19 kg/m    CVE: Dilation: Fingertip Effacement (%): Thick Cervical Position: Middle Presentation: Undeterminable Exam by:: LIma, rn   A&P: 34 y.o. G1P0 [redacted]w[redacted]d who presented for fetal demise, admission course complicated by DIC and asymptomatic positive COVID.   #Fetal Demise: s/p cytotec x2, will proceed w additional dose cytotec   #DIC: bleeding has decreased since admission   -s/p 2 units FFP, 2 units cryo  -continue DIC lab monitoring q4hrs as transfuse as needed   #COVID-19: asymptomatic at this time, continue to monitor respiratory status.   #Pain: as needed. S/p fentanyl at 1700. Likely will not be candidate for epidural.     [redacted]w[redacted]d, MD 6:22 PM

## 2019-11-18 NOTE — Progress Notes (Signed)
2130Isidore Moos called and notified of pt 31 weeks presenting to ED with gush of blood. Unable to recall fetal movement since 0130 this morning. Denies intercourse, unsure of ROM or ctx at this time. Pt placed on fetal monitor, RN unable to find fetal heart tones. Doppler attempted yet unsuccessful. RN attempting to get bedside ultrasound and will call back with result.  0711: Dr. Adrian Blackwater called, will return call.   0715: Report given to oncoming [facility] Marvell Fuller.

## 2019-11-18 NOTE — Anesthesia Preprocedure Evaluation (Deleted)
Anesthesia Evaluation  Patient identified by MRN, date of birth, ID band Patient awake    Reviewed: Allergy & Precautions, NPO status , Patient's Chart, lab work & pertinent test results  Airway Mallampati: II  TM Distance: >3 FB Neck ROM: Full    Dental no notable dental hx. (+) Teeth Intact   Pulmonary asthma , Current Smoker and Patient abstained from smoking.,    Pulmonary exam normal breath sounds clear to auscultation       Cardiovascular negative cardio ROS Normal cardiovascular exam Rhythm:Regular Rate:Normal     Neuro/Psych negative neurological ROS  negative psych ROS   GI/Hepatic Neg liver ROS, GERD  Medicated and Poorly Controlled,  Endo/Other  diabetes, GestationalDiet controlled- GDM Obesity  Renal/GU negative Renal ROS  negative genitourinary   Musculoskeletal negative musculoskeletal ROS (+)   Abdominal (+) + obese,   Peds  Hematology DIC - early Thrombocytopenia - Plt 90k D-dimer 9.49 Fibrinogen 146   Anesthesia Other Findings   Reproductive/Obstetrics (+) Pregnancy Placental abruption IUFD 31 weeks Early DIC                             Anesthesia Physical Anesthesia Plan  ASA: III  Anesthesia Plan:    Post-op Pain Management:    Induction: Intravenous  PONV Risk Score and Plan:   Airway Management Planned:   Additional Equipment:   Intra-op Plan:   Post-operative Plan:   Informed Consent:   Plan Discussed with:   Anesthesia Plan Comments: (I saw Whitney Vasquez as she is high risk for possible C/Section and significant bleeding. I discussed with her options for pain control. At present I would not place a lumbar epidural for pain control. I started a second IV and drew further labs. Will continue to follow.)        Anesthesia Quick Evaluation

## 2019-11-18 NOTE — ED Provider Notes (Signed)
[facility] EMERGENCY DEPARTMENT Provider Note   CSN: [id number] Arrival date & time: 11/18/19  4098     History Chief Complaint  Patient presents with  . Vaginal Bleeding    Whitney Vasquez is a 34 y.o. female.  Patient is a 33 year old female G1 at approximately [redacted] weeks gestation presenting for evaluation of vaginal bleeding.  Patient's pregnancy thus far has been complicated by cocaine use.  Patient states she woke this morning with a gush of blood from her vagina.  She is also now having some lower abdominal cramping.  She tells me the last time she felt the baby move was at approximately 1:30 AM.  The history is provided by the patient.  Vaginal Bleeding Quality:  Bright red Severity:  Moderate Onset quality:  Sudden Timing:  Intermittent Progression:  Waxing and waning Chronicity:  New Relieved by:  Nothing Worsened by:  Nothing      Past Medical History:  Diagnosis Date  . Asthma     Patient Active Problem List   Diagnosis Date Noted  . Gestational diabetes mellitus, class A1 10/29/2019  . Leukocytosis 10/29/2019  . Cocaine use 07/09/2019  . Supervision of high risk pregnancy, antepartum 07/08/2019    Past Surgical History:  Procedure Laterality Date  . NO PAST SURGERIES       OB History    Gravida  1   Para      Term      Preterm      AB      Living        SAB      TAB      Ectopic      Multiple      Live Births              Family History  Problem Relation Age of Onset  . COPD Maternal Grandmother   . Arthritis Maternal Grandmother   . Cancer Paternal Uncle     Social History   Tobacco Use  . Smoking status: Light Tobacco Smoker    Packs/day: 0.25    Types: Cigarettes    Last attempt to quit: 05/10/2019    Years since quitting: 0.5  . Smokeless tobacco: Never Used  Vaping Use  . Vaping Use: Never used  Substance Use Topics  . Alcohol use: Not Currently    Comment: every other day-beer  . Drug use: Not  Currently    Types: Marijuana    Comment: not used in one month    Home Medications Prior to Admission medications   Medication Sig Start Date End Date Taking? Authorizing Provider  Accu-Chek Softclix Lancets lancets Use as instructed to check blood sugar 4 times daily 10/29/19   Roma Schanz, CNM  Acetaminophen (TYLENOL PO) Take by mouth.    [provider]  albuterol (VENTOLIN HFA) 108 (90 Base) MCG/ACT inhaler Inhale 2 puffs into the lungs every 6 (six) hours as needed for wheezing or shortness of breath. 05/27/19   Estill Dooms, NP  Blood Glucose Monitoring Suppl (ACCU-CHEK GUIDE ME) w/Device KIT 1 each by Does not apply route 4 (four) times daily. 10/29/19   Roma Schanz, CNM  Blood Pressure Monitor MISC For regular home bp monitoring during pregnancy 07/08/19   Roma Schanz, CNM  diphenhydrAMINE HCl (BENADRYL PO) Take by mouth.    [provider]  glucose blood (ACCU-CHEK GUIDE) test strip Use as instructed to check blood sugar 4 times daily 10/29/19   Gertie Exon,  Royetta Crochet, CNM  Prenatal Vit-Fe Fumarate-FA (PRENATAL VITAMIN PO) Take by mouth.    [provider]    Allergies    Patient has no known allergies.  Review of Systems   Review of Systems  Genitourinary: Positive for vaginal bleeding.  All other systems reviewed and are negative.   Physical Exam Updated Vital Signs BP 93/66   Pulse 67   Temp 97.8 F (36.6 C) (Oral)   Ht 5' 4"  (1.626 m)   Wt 93 kg   LMP 04/07/2019 (Exact Date)   SpO2 100%   BMI 35.19 kg/m   Physical Exam Vitals and nursing note reviewed.  Constitutional:      General: She is not in acute distress.    Appearance: She is well-developed. She is not diaphoretic.  HENT:     Head: Normocephalic and atraumatic.  Cardiovascular:     Rate and Rhythm: Normal rate and regular rhythm.     Heart sounds: No murmur heard.  No friction rub. No gallop.   Pulmonary:     Effort: Pulmonary effort is normal. No  respiratory distress.     Breath sounds: Normal breath sounds. No wheezing.  Abdominal:     General: Bowel sounds are normal. There is no distension.     Palpations: Abdomen is soft.     Tenderness: There is abdominal tenderness.     Comments: Abdominal exam reveals a gravid uterus consistent with stated gestational age.  There is uterine tenderness with palpation.  Musculoskeletal:        General: Normal range of motion.     Cervical back: Normal range of motion and neck supple.  Skin:    General: Skin is warm and dry.  Neurological:     Mental Status: She is alert and oriented to person, place, and time.     ED Results / Procedures / Treatments   Labs (all labs ordered are listed, but only abnormal results are displayed) Labs Reviewed  BASIC METABOLIC PANEL - Abnormal; Notable for the following components:      Result Value   Potassium 3.1 (*)    BUN 5 (*)    Calcium 8.3 (*)    All other components within normal limits  RAPID URINE DRUG SCREEN, HOSP PERFORMED - Abnormal; Notable for the following components:   Cocaine POSITIVE (*)    All other components within normal limits  CBC WITH DIFFERENTIAL/PLATELET  URINALYSIS, ROUTINE W REFLEX MICROSCOPIC  DIC (DISSEMINATED INTRAVASCULAR COAGULATION) PANEL (NOT AT [facility])  TYPE AND SCREEN    EKG None  Radiology No results found.  Procedures Procedures (including critical care time)  Medications Ordered in ED Medications  0.9 %  sodium chloride infusion ( Intravenous New Bag/Given 11/18/19 0742)    ED Course  I have reviewed the triage vital signs and the nursing notes.  Pertinent labs & imaging results that were available during my care of the patient were reviewed by me and considered in my medical decision making (see chart for details).    MDM Rules/Calculators/A&P  Patient pregnant G1 at [redacted] weeks gestation presenting with lower abdominal pain and cramping.  Patient brought immediately from triage to the exam room  and was placed on the fetal monitor were no fetal heart tones were detected.  Immediately upon my arrival at the start of my shift, I was made aware of this patient and went to evaluate her.  Bedside ultrasound was performed and I was unable to identify any fetal heart tones  or fetal movement.  I then spoke with Dr. Elonda Husky from the [facility] and informed him of this patient's presence in the ER.  He has spoken with Dr. Glo Herring who has come to the ER to evaluate the patient.  Formal ultrasound was obtained at bedside and reveals what appears to be a placental abruption and fetal demise.  Dr. Glo Herring making arrangements for patient transfer to women's for further management.  CRITICAL CARE Performed by: Veryl Speak Total critical care time: 45 minutes Critical care time was exclusive of separately billable procedures and treating other patients. Critical care was necessary to treat or prevent imminent or life-threatening deterioration. Critical care was time spent personally by me on the following activities: development of treatment plan with patient and/or surrogate as well as nursing, discussions with consultants, evaluation of patient's response to treatment, examination of patient, obtaining history from patient or surrogate, ordering and performing treatments and interventions, ordering and review of laboratory studies, ordering and review of radiographic studies, pulse oximetry and re-evaluation of patient's condition.   Final Clinical Impression(s) / ED Diagnoses Final diagnoses:  Heart rate slow    Rx / DC Orders ED Discharge Orders    None       Veryl Speak, MD 11/18/19 [phone]

## 2019-11-19 ENCOUNTER — Encounter (HOSPITAL_COMMUNITY): Payer: Self-pay | Admitting: Family Medicine

## 2019-11-19 DIAGNOSIS — F1419 Cocaine abuse with unspecified cocaine-induced disorder: Secondary | ICD-10-CM

## 2019-11-19 DIAGNOSIS — F1510 Other stimulant abuse, uncomplicated: Secondary | ICD-10-CM

## 2019-11-19 DIAGNOSIS — O24420 Gestational diabetes mellitus in childbirth, diet controlled: Secondary | ICD-10-CM

## 2019-11-19 DIAGNOSIS — O45023 Premature separation of placenta with disseminated intravascular coagulation, third trimester: Secondary | ICD-10-CM

## 2019-11-19 DIAGNOSIS — Z3A31 31 weeks gestation of pregnancy: Secondary | ICD-10-CM

## 2019-11-19 DIAGNOSIS — O364XX0 Maternal care for intrauterine death, not applicable or unspecified: Principal | ICD-10-CM

## 2019-11-19 DIAGNOSIS — O99324 Drug use complicating childbirth: Secondary | ICD-10-CM

## 2019-11-19 LAB — PREPARE CRYOPRECIPITATE
Unit division: 0
Unit division: 0

## 2019-11-19 LAB — BPAM FFP
Blood Product Expiration Date: 202108162359
Blood Product Expiration Date: 202108162359
ISSUE DATE / TIME: 202108111054
ISSUE DATE / TIME: 202108111054
Unit Type and Rh: 8400
Unit Type and Rh: 8400

## 2019-11-19 LAB — CBC
HCT: 30.3 % — ABNORMAL LOW (ref 36.0–46.0)
HCT: 29.7 % — ABNORMAL LOW (ref 36.0–46.0)
Hemoglobin: 9.9 g/dL — ABNORMAL LOW (ref 12.0–15.0)
Hemoglobin: 9.9 g/dL — ABNORMAL LOW (ref 12.0–15.0)
MCH: 27 pg (ref 26.0–34.0)
MCH: 28.1 pg (ref 26.0–34.0)
MCHC: 32.7 g/dL (ref 30.0–36.0)
MCHC: 33.3 g/dL (ref 30.0–36.0)
MCV: 82.6 fL (ref 80.0–100.0)
MCV: 84.4 fL (ref 80.0–100.0)
Platelets: 195 10*3/uL (ref 150–400)
Platelets: 226 10*3/uL (ref 150–400)
RBC: 3.67 MIL/uL — ABNORMAL LOW (ref 3.87–5.11)
RBC: 3.52 MIL/uL — ABNORMAL LOW (ref 3.87–5.11)
RDW: 14.1 % (ref 11.5–15.5)
RDW: 14.3 % (ref 11.5–15.5)
WBC: 19.4 10*3/uL — ABNORMAL HIGH (ref 4.0–10.5)
WBC: 25.5 10*3/uL — ABNORMAL HIGH (ref 4.0–10.5)
nRBC: 0 % (ref 0.0–0.2)
nRBC: 0 % (ref 0.0–0.2)

## 2019-11-19 LAB — PREPARE PLATELET PHERESIS
Unit division: 0
Unit division: 0

## 2019-11-19 LAB — BPAM PLATELET PHERESIS
Blood Product Expiration Date: 202108122359
Blood Product Expiration Date: 202108122359
ISSUE DATE / TIME: 202108111034
ISSUE DATE / TIME: 202108111034
Unit Type and Rh: 6200
Unit Type and Rh: 6200

## 2019-11-19 LAB — PROTIME-INR
INR: 1.1 (ref 0.8–1.2)
INR: 1 (ref 0.8–1.2)
INR: 1.1 (ref 0.8–1.2)
INR: 1 (ref 0.8–1.2)
INR: 1 (ref 0.8–1.2)
Prothrombin Time: 13.7 seconds (ref 11.4–15.2)
Prothrombin Time: 13.2 seconds (ref 11.4–15.2)
Prothrombin Time: 13.5 seconds (ref 11.4–15.2)
Prothrombin Time: 13.1 seconds (ref 11.4–15.2)
Prothrombin Time: 13.2 seconds (ref 11.4–15.2)

## 2019-11-19 LAB — BPAM CRYOPRECIPITATE
Blood Product Expiration Date: 202108111614
Blood Product Expiration Date: 202108111614
ISSUE DATE / TIME: 202108111044
ISSUE DATE / TIME: 202108111044
Unit Type and Rh: 6200
Unit Type and Rh: 6200

## 2019-11-19 LAB — PATHOLOGIST SMEAR REVIEW

## 2019-11-19 LAB — RPR: RPR Ser Ql: NONREACTIVE

## 2019-11-19 LAB — PREPARE FRESH FROZEN PLASMA

## 2019-11-19 LAB — FIBRINOGEN
Fibrinogen: 304 mg/dL (ref 210–475)
Fibrinogen: 348 mg/dL (ref 210–475)
Fibrinogen: 382 mg/dL (ref 210–475)

## 2019-11-19 LAB — APTT
aPTT: 30 seconds (ref 24–36)
aPTT: 32 seconds (ref 24–36)

## 2019-11-19 LAB — D-DIMER, QUANTITATIVE
D-Dimer, Quant: 2.35 ug/mL-FEU — ABNORMAL HIGH (ref 0.00–0.50)
D-Dimer, Quant: 2.87 ug/mL-FEU — ABNORMAL HIGH (ref 0.00–0.50)

## 2019-11-19 MED ORDER — ONDANSETRON HCL 4 MG/2ML IJ SOLN: 4.0000 mg | INTRAMUSCULAR | Status: DC | PRN

## 2019-11-19 MED ORDER — NALOXONE HCL 0.4 MG/ML IJ SOLN: 0.4000 mg | INTRAMUSCULAR | Status: DC | PRN

## 2019-11-19 MED ORDER — FENTANYL 50 MCG/ML IV PCA SOLN: INTRAVENOUS | Status: DC

## 2019-11-19 MED ORDER — BENZOCAINE-MENTHOL 20-0.5 % EX AERO: 1.0000 "application " | INHALATION_SPRAY | CUTANEOUS | Status: DC | PRN

## 2019-11-19 MED ORDER — COCONUT OIL OIL: 1.0000 "application " | TOPICAL_OIL | Status: DC | PRN

## 2019-11-19 MED ORDER — DIPHENHYDRAMINE HCL 25 MG PO CAPS: 25.0000 mg | ORAL_CAPSULE | Freq: Four times a day (QID) | ORAL | Status: DC | PRN

## 2019-11-19 MED ORDER — HYDROMORPHONE HCL 1 MG/ML IJ SOLN
INTRAMUSCULAR | Status: AC
  Administered 2019-11-19: 1 mg via INTRAVENOUS
  Filled 2019-11-19: qty 1

## 2019-11-19 MED ORDER — WITCH HAZEL-GLYCERIN EX PADS: 1.0000 "application " | MEDICATED_PAD | CUTANEOUS | Status: DC | PRN

## 2019-11-19 MED ORDER — TETANUS-DIPHTH-ACELL PERTUSSIS 5-2.5-18.5 LF-MCG/0.5 IM SUSP: 0.5000 mL | Freq: Once | INTRAMUSCULAR | Status: DC

## 2019-11-19 MED ORDER — OXYTOCIN-SODIUM CHLORIDE 30-0.9 UT/500ML-% IV SOLN
1.0000 m[IU]/min | INTRAVENOUS | Status: DC
  Administered 2019-11-19: 2 m[IU]/min via INTRAVENOUS
  Filled 2019-11-19: qty 500

## 2019-11-19 MED ORDER — ONDANSETRON HCL 4 MG/2ML IJ SOLN: 4.0000 mg | Freq: Four times a day (QID) | INTRAMUSCULAR | Status: DC | PRN

## 2019-11-19 MED ORDER — PRENATAL MULTIVITAMIN CH: 1.0000 | ORAL_TABLET | Freq: Every day | ORAL | Status: DC

## 2019-11-19 MED ORDER — DIPHENHYDRAMINE HCL 12.5 MG/5ML PO ELIX
12.5000 mg | ORAL_SOLUTION | Freq: Four times a day (QID) | ORAL | Status: DC | PRN
  Filled 2019-11-19: qty 5

## 2019-11-19 MED ORDER — ONDANSETRON HCL 4 MG PO TABS: 4.0000 mg | ORAL_TABLET | ORAL | Status: DC | PRN

## 2019-11-19 MED ORDER — HYDROMORPHONE HCL 1 MG/ML IJ SOLN
INTRAMUSCULAR | Status: AC
  Filled 2019-11-19: qty 1

## 2019-11-19 MED ORDER — SIMETHICONE 80 MG PO CHEW: 80.0000 mg | CHEWABLE_TABLET | ORAL | Status: DC | PRN

## 2019-11-19 MED ORDER — DIBUCAINE (PERIANAL) 1 % EX OINT: 1.0000 "application " | TOPICAL_OINTMENT | CUTANEOUS | Status: DC | PRN

## 2019-11-19 MED ORDER — SENNOSIDES-DOCUSATE SODIUM 8.6-50 MG PO TABS
2.0000 | ORAL_TABLET | ORAL | Status: DC
  Administered 2019-11-19: 2 via ORAL
  Filled 2019-11-19: qty 2

## 2019-11-19 MED ORDER — ZOLPIDEM TARTRATE 5 MG PO TABS: 5.0000 mg | ORAL_TABLET | Freq: Every evening | ORAL | Status: DC | PRN

## 2019-11-19 MED ORDER — DIPHENHYDRAMINE HCL 50 MG/ML IJ SOLN: 12.5000 mg | Freq: Four times a day (QID) | INTRAMUSCULAR | Status: DC | PRN

## 2019-11-19 MED ORDER — SODIUM CHLORIDE 0.9% FLUSH: 9.0000 mL | INTRAVENOUS | Status: DC | PRN

## 2019-11-19 MED ORDER — ACETAMINOPHEN 325 MG PO TABS
650.0000 mg | ORAL_TABLET | ORAL | Status: DC | PRN
  Administered 2019-11-19: 650 mg via ORAL
  Filled 2019-11-19: qty 2

## 2019-11-19 MED ORDER — HYDROMORPHONE HCL 1 MG/ML IJ SOLN
1.0000 mg | INTRAMUSCULAR | Status: DC | PRN
  Administered 2019-11-19: 1 mg via INTRAVENOUS
  Filled 2019-11-19: qty 1

## 2019-11-19 NOTE — Progress Notes (Signed)
POSTPARTUM PROGRESS NOTE  Post Partum Day 0  Subjective:  Whitney Vasquez is a 34 y.o. G1P0100 s/p induced vaginal delivery at [redacted]w[redacted]d due to IUFD.  She reports she is doing well.  She denies any problems with ambulating, voiding or po intake. Denies nausea or vomiting.  Pain is well controlled.  Lochia is normal.  Objective: Blood pressure 107/67, pulse 88, temperature 98.1 F (36.7 C), temperature source Oral, resp. rate 18, height 5\' 4"  (1.626 m), weight 93 kg, last menstrual period 04/07/2019, SpO2 98 %.  Physical Exam:  General: alert, cooperative and no distress Chest: no respiratory distress Heart:regular rate, distal pulses intact Abdomen: soft, nontender,  Uterine Fundus: firm, appropriately tender DVT Evaluation: No calf swelling or tenderness Extremities:  edema Skin: warm, dry  Recent Labs    11/19/19 0134 11/19/19 0824  HGB 9.9* 9.9*  HCT 30.3* 29.7*    Assessment/Plan: Whitney Vasquez is a 34 y.o. G1P0100 s/p vaginal delivery at [redacted]w[redacted]d   PPD#0 - Doing well  Routine postpartum care CBC and fibrinogen at 1200 Contraception:  Will discuss this afternoon   LOS: 1 day   [redacted]w[redacted]d, MD Faculty attending 11/19/2019, 11:46 AM

## 2019-11-19 NOTE — Progress Notes (Addendum)
Whitney Vasquez is a 34 y.o. G1P0 at [redacted]w[redacted]d admitted for IOL 2/2 IUFD in the context of +cocaine/placental abruption  Subjective: Was resting comfortably, FB out.  Objective: BP 120/83    Pulse 80    Temp 98.5 F (36.9 C) (Axillary)    Resp 18    Ht 5\' 4"  (1.626 m)    Wt 93 kg    LMP 04/07/2019 (Exact Date)    SpO2 100%    BMI 35.19 kg/m  No intake/output data recorded.  SVE:   Dilation: 5 Effacement (%): 100 Station: 0 Exam by:: Kmya Placide DO  Labs: Lab Results  Component Value Date   WBC 19.4 (H) 11/19/2019   HGB 9.9 (L) 11/19/2019   HCT 30.3 (L) 11/19/2019   MCV 82.6 11/19/2019   PLT 195 11/19/2019    Assessment / Plan: 34 yo G1P0 at 31.0 EGA who is here for IOL 2/2 IUFD in the context of +cocaine/placental abruption  Labor: s/p 4 doses of cytotec, FB out. AROM this exam with scant clear fluid. Will start pitocin. Pain Control: Desires epidural at this point DIC: mild amount of blood on glove, blood now darker. S/p 2 units of FFP, 2 units of cryo. DIC labs reviewed- stable. Continue q4h labs, transfuse PRN Asymptomatic COVID:  No symptoms, O2 sat fine Anticipated MOD:  vaginal  Rashaun Curl L Zadin Lange DO OB Fellow, Faculty Practice 11/19/2019, 3:05 AM

## 2019-11-19 NOTE — Discharge Summary (Signed)
Postpartum Discharge Summary  Date of Service updated 11/20/2019     Patient Name: Whitney Vasquez DOB: 10-Jul-1985 MRN: [id number]  Date of admission: 11/18/2019 Delivery date:11/19/2019  Delivering provider: Merilyn Baba  Date of discharge: 11/20/2019  Admitting diagnosis: Heart rate slow [R00.1] Placental abruption in third trimester [O45.93] Fetal demise affecting delivery [O36.4XX0] Intrauterine pregnancy: [redacted]w[redacted]d    Secondary diagnosis:  Principal Problem:   Fetal demise affecting delivery Active Problems:   Cocaine use   Gestational diabetes mellitus, class A1   Placental abruption in third trimester   DIC (disseminated intravascular coagulation) ([place]  Additional problems:     Discharge diagnosis: Preterm Pregnancy Delivered and DIC, placental abruption                                              Post partum procedures:  Augmentation: AROM, Pitocin, Cytotec and IP Foley Complications: Placental Abruption and Stillborn, IUFD, Cord Avulsion  Hospital course: Induction of Labor With Vaginal Delivery   34y.o. yo G1P0 at 323w1das admitted to the hospital 11/18/2019 for induction of labor.  Indication for induction: IUFD/placental abruption.  Patient had an uncomplicated labor course as follows:  Induction was started with several doses of cytotec. Eventually a FB was able to be placed. [facility], progressing to complete and delivering after a short second stage.  Membrane Rupture Time/Date: 2:56 AM ,11/19/2019   Delivery Method:Vaginal, Spontaneous  Episiotomy: None  Lacerations:  Periurethral  Details of delivery can be found in separate delivery note.  Patient had a routine postpartum course. DIC labs remained stable. Patient is discharged home  11/20/2019   Newborn Data: Birth date:11/19/2019  Birth time:4:07 AM  Gender:Female  Living status:Fetal Demise  Apgars:0 ,0  Weight:1296 g   Magnesium Sulfate received:  No BMZ received: No Rhophylac:N/A MMR:N/A T-DaP:Given postpartum Flu: N/A Transfusion:Yes- FFP and cryoprecipitate  Physical exam  Vitals:   11/19/19 1212 11/19/19 1612 11/19/19 1953 11/20/19 0822  BP: 105/62 123/68 132/79 120/69  Pulse: 89 85 100 85  Resp: 18 17 18 18   Temp: 98.2 F (36.8 C) 98.1 F (36.7 C) 98.4 F (36.9 C) 98.1 F (36.7 C)  TempSrc: Oral Oral Oral Oral  SpO2: 99% 100% 100% 100%  Weight:      Height:       General: alert, cooperative and no distress Lochia: appropriate Uterine Fundus: firm Incision: N/A DVT Evaluation: No evidence of DVT seen on physical exam. Labs: Lab Results  Component Value Date   WBC 25.5 (H) 11/19/2019   HGB 9.9 (L) 11/19/2019   HCT 29.7 (L) 11/19/2019   MCV 84.4 11/19/2019   PLT 226 11/19/2019   CMP Latest Ref Rng & Units 11/18/2019  Glucose 70 - 99 mg/dL 99  BUN 6 - 20 mg/dL 5(L)  Creatinine 0.44 - 1.00 mg/dL 0.77  Sodium 135 - 145 mmol/L 138  Potassium 3.5 - 5.1 mmol/L 3.1(L)  Chloride 98 - 111 mmol/L 104  CO2 22 - 32 mmol/L 23  Calcium 8.9 - 10.3 mg/dL 8.3(L)  Total Protein 6.5 - 8.1 g/dL -  Total Bilirubin 0.3 - 1.2 mg/dL -  Alkaline Phos 38 - 126 U/L -  AST 15 - 41 U/L -  ALT 14 - 54 U/L -   Edinburgh Score: No flowsheet data found.   After  visit meds:  Allergies as of 11/20/2019   No Known Allergies     Medication List    STOP taking these medications   Accu-Chek Guide Me w/Device Kit   Accu-Chek Guide test strip Generic drug: glucose blood   Accu-Chek Softclix Lancets lancets   BENADRYL PO   Blood Pressure Monitor Misc   TYLENOL PO     TAKE these medications   albuterol 108 (90 Base) MCG/ACT inhaler Commonly known as: VENTOLIN HFA Inhale 2 puffs into the lungs every 6 (six) hours as needed for wheezing or shortness of breath.   PRENATAL VITAMIN PO Take by mouth.        Discharge home in stable condition Infant [place] Discharge instruction: per After Visit Summary  and Postpartum booklet. Activity: Advance as tolerated. Pelvic rest for 6 weeks.  Diet: routine diet Future Appointments: Future Appointments  Date Time Provider [place]  11/24/2019  9:15 AM [facility]  12/24/2019  1:50 PM Cresenzo-Dishmon, Joaquim Lai, CNM [facility]   Follow up Visit:  Follow-up Information    [facility] Follow up in 1 week(s).   Specialty: Obstetrics and Gynecology Contact information: [address] [place] [phone]               Please schedule this patient for a In person postpartum visit in 2 - 4 weeks with the following provider: Any provider. Additional Postpartum F/U:2 hour GTT in 4-6 weeks High risk pregnancy complicated by: GDM and cocaine use/placental abruption Delivery mode:  Vaginal, Spontaneous  Anticipated Birth Control:  Unsure   11/20/2019 Emeterio Reeve, MD

## 2019-11-19 NOTE — Plan of Care (Signed)
  Problem: Education: Goal: Knowledge of General Education information will improve Description: Including pain rating scale, medication(s)/side effects and non-pharmacologic comfort measures Outcome: Completed/Met

## 2019-11-19 NOTE — Progress Notes (Signed)
I introduced spiritual care services to [facility].  She was spending time with her baby and was not feeling up to talking much.  She stated that she has a good support system and I let her know of our ongoing availability for emotional and spiritual support.  Chaplain Dyanne Carrel, Bcc Pager, [phone] 1:12 PM

## 2019-11-20 LAB — PROTIME-INR
INR: 1.1 (ref 0.8–1.2)
INR: 1.1 (ref 0.8–1.2)
INR: 1 (ref 0.8–1.2)
Prothrombin Time: 13.4 seconds (ref 11.4–15.2)
Prothrombin Time: 13.7 seconds (ref 11.4–15.2)
Prothrombin Time: 12.9 seconds (ref 11.4–15.2)

## 2019-11-20 NOTE — Progress Notes (Signed)
Pt ambulated at discharge with all belongings. Pt in stable condition at discharge.

## 2019-11-20 NOTE — Progress Notes (Signed)
Pt given discharge instructions and IVs discontinued. All questions answered.

## 2019-11-20 NOTE — Discharge Instructions (Signed)
Vaginal Delivery, Care After This sheet gives you information about how to care for yourself after your delivery. Your health care provider may also give you more specific instructions. If you have problems or questions, contact your health care provider. What can I expect after the procedure? After delivery, it is common to have:  Soreness in your abdomen, your vagina, and the area between the opening of your vagina and your anus (perineum).  Tiredness (fatigue).  Cramps.  Breast tenderness related to engorgement.  Some bleeding and discharge from your vagina. This may continue for about 6 weeks. The bleeding and discharge will start out red, then become pink, then yellow, and finally white.  Tenderness in your vagina or perineum if you had an episiotomy or a vaginal tear. This may last several weeks.  Emotions that change quickly. Common emotions include: ? Sadness. ? Anger. ? Denial. ? Guilt. ? Depression. Follow these instructions at home: Vaginal and perineal care  Keep your perineum clean and dry as told by your health care provider.  Wipe from front to back when you use the toilet.  If you have an episiotomy or a vaginal tear, check for signs of infection, such as: ? Increasing redness, swelling, or pain in your perineal area. ? Pus or bad-smelling discharge coming from your wound or vagina.  To relieve pain at the wound area or pain caused by hemorrhoids, try taking a warm sitz bath 2-3 times a day.  Do not use tampons or douche until your health care provider says it is okay. Medicines  Take over-the-counter and prescription medicines only as told by your health care provider.  If you were prescribed an antibiotic medicine, take it as told by your health care provider. Do not stop taking the antibiotic even if you start to feel better. Eating and drinking   Drink enough fluids to keep your urine pale yellow.  Eat high-fiber foods every day. These foods may help  prevent or relieve constipation. High-fiber foods include: ? Whole grain cereals and breads. ? Brown rice. ? Beans. ? Fresh fruits and vegetables. Activity  If possible, have someone help you with your household activities for at least a few days after you leave the hospital.  Return to your normal activities as told by your health care provider. Ask your health care provider what activities are safe for you.  Rest as much as possible.  Talk with your health care provider about when you can engage in sexual activity. This may depend on your: ? Risk of infection. ? Rate of healing. ? Comfort and desire to engage in sexual activity. Emotional support  Consider seeking support for your loss. Some forms of support that you might consider include your religious leader, friends, family, a professional counselor, or a bereavement support group. General instructions  Wear a supportive and well-fitting bra.  If you pass a blood clot, save it and call your health care provider to discuss. Do not flush blood clots down the toilet before you get instructions from your health care provider.  Keep all of your scheduled postpartum visits. At these visits, your health care provider will check to make sure that you are healing, both physically and emotionally. Contact a health care provider if:  You feel sad or depressed.  You are having trouble eating or sleeping.  You lose interest in activities you used to enjoy.  You pass a blood clot from your vagina.  You have pus or a bad-smelling discharge coming from   your wound or vagina.  You have increasing redness, swelling, or pain in your perineal area.  You feel pain or burning when you urinate.  You urinate more often than normal.  You have a fever.  Your breasts become hard, red, or painful.  You are dizzy or light-headed.  You have a rash.  You feel nauseous or you vomit.  You have not had a menstrual period by the 12th week  after delivery. Get help right away if:  You have persistent pain that is not relieved by comfort measures or medicines.  You have chest pain or trouble breathing.  You have blurred vision or you see spots.  You have a severe headache.  You faint.  You have sudden, severe leg pain.  You bleed from your vagina so much that you fill more than one sanitary pad in one hour. Bleeding should not be heavier than your heaviest period.  You have thoughts of hurting yourself. If you ever feel like you may hurt yourself or others, or have thoughts about taking your own life, get help right away. You can go to your nearest emergency department or call:  Your local emergency services (911 in the [place]  A suicide crisis helpline, such as the National Suicide Prevention Lifeline at 1-800-273-8255. This is open 24 hours a day. Summary  Take over-the-counter and prescription medicines only as told by your health care provider.  Return to your normal activities as told by your health care provider. Ask your health care provider what activities are safe for you.  Consider getting support for your loss. Sources of support include religious leaders, friends, family, professional counselors, and bereavement support groups.  Keep all follow-up visits as told by your health care provider. This is important. This information is not intended to replace advice given to you by your health care provider. Make sure you discuss any questions you have with your health care provider. Document Revised: 04/10/2017 Document Reviewed: 07/05/2016 Elsevier Patient Education  2020 Elsevier Inc.  

## 2019-11-22 LAB — TYPE AND SCREEN
ABO/RH(D): AB POS
Antibody Screen: NEGATIVE
Unit division: 0
Unit division: 0

## 2019-11-22 LAB — BPAM RBC
Blood Product Expiration Date: 202109082359
Blood Product Expiration Date: 202109112359
Unit Type and Rh: 8400
Unit Type and Rh: 8400

## 2019-11-24 ENCOUNTER — Other Ambulatory Visit: Payer: BC Managed Care – PPO

## 2019-11-24 LAB — SURGICAL PATHOLOGY

## 2019-11-25 ENCOUNTER — Encounter: Payer: BC Managed Care – PPO | Admitting: Women's Health

## 2019-11-27 ENCOUNTER — Inpatient Hospital Stay (HOSPITAL_COMMUNITY)
Admission: AD | Admit: 2019-11-27 | Discharge: 2019-11-27 | Disposition: A | Discharge status: Home / Self Care | Payer: BC Managed Care – PPO | Attending: Obstetrics and Gynecology | Admitting: Obstetrics and Gynecology

## 2019-11-27 ENCOUNTER — Encounter: Payer: Self-pay | Admitting: Women's Health

## 2019-11-27 ENCOUNTER — Other Ambulatory Visit: Payer: Self-pay

## 2019-11-27 ENCOUNTER — Ambulatory Visit (INDEPENDENT_AMBULATORY_CARE_PROVIDER_SITE_OTHER): Payer: BC Managed Care – PPO | Admitting: Women's Health

## 2019-11-27 ENCOUNTER — Encounter (HOSPITAL_COMMUNITY): Payer: Self-pay | Admitting: Obstetrics and Gynecology

## 2019-11-27 VITALS — BP 160/101 | HR 81 | Ht 64.0 in | Wt 202.0 lb

## 2019-11-27 DIAGNOSIS — Z8616 Personal history of COVID-19: Secondary | ICD-10-CM | POA: Insufficient documentation

## 2019-11-27 DIAGNOSIS — Z8759 Personal history of other complications of pregnancy, childbirth and the puerperium: Secondary | ICD-10-CM

## 2019-11-27 DIAGNOSIS — Z8751 Personal history of pre-term labor: Secondary | ICD-10-CM | POA: Diagnosis not present

## 2019-11-27 DIAGNOSIS — F1490 Cocaine use, unspecified, uncomplicated: Secondary | ICD-10-CM

## 2019-11-27 DIAGNOSIS — F17210 Nicotine dependence, cigarettes, uncomplicated: Secondary | ICD-10-CM | POA: Diagnosis not present

## 2019-11-27 DIAGNOSIS — J45909 Unspecified asthma, uncomplicated: Secondary | ICD-10-CM | POA: Insufficient documentation

## 2019-11-27 DIAGNOSIS — R030 Elevated blood-pressure reading, without diagnosis of hypertension: Secondary | ICD-10-CM

## 2019-11-27 DIAGNOSIS — O99325 Drug use complicating the puerperium: Secondary | ICD-10-CM

## 2019-11-27 DIAGNOSIS — Z0130 Encounter for examination of blood pressure without abnormal findings: Secondary | ICD-10-CM

## 2019-11-27 DIAGNOSIS — O165 Unspecified maternal hypertension, complicating the puerperium: Secondary | ICD-10-CM | POA: Diagnosis present

## 2019-11-27 DIAGNOSIS — O99335 Smoking (tobacco) complicating the puerperium: Secondary | ICD-10-CM | POA: Diagnosis not present

## 2019-11-27 LAB — CBC
HCT: 31.5 % — ABNORMAL LOW (ref 36.0–46.0)
Hemoglobin: 9.8 g/dL — ABNORMAL LOW (ref 12.0–15.0)
MCH: 26.7 pg (ref 26.0–34.0)
MCHC: 31.1 g/dL (ref 30.0–36.0)
MCV: 85.8 fL (ref 80.0–100.0)
Platelets: 397 K/uL (ref 150–400)
RBC: 3.67 MIL/uL — ABNORMAL LOW (ref 3.87–5.11)
RDW: 14 % (ref 11.5–15.5)
WBC: 11.7 K/uL — ABNORMAL HIGH (ref 4.0–10.5)
nRBC: 0 % (ref 0.0–0.2)

## 2019-11-27 LAB — COMPREHENSIVE METABOLIC PANEL
ALT: 21 U/L (ref 0–44)
AST: 23 U/L (ref 15–41)
Albumin: 3.2 g/dL — ABNORMAL LOW (ref 3.5–5.0)
Alkaline Phosphatase: 79 U/L (ref 38–126)
Anion gap: 14 (ref 5–15)
BUN: 6 mg/dL (ref 6–20)
CO2: 21 mmol/L — ABNORMAL LOW (ref 22–32)
Calcium: 9 mg/dL (ref 8.9–10.3)
Chloride: 108 mmol/L (ref 98–111)
Creatinine, Ser: 1.04 mg/dL — ABNORMAL HIGH (ref 0.44–1.00)
GFR calc Af Amer: >60 mL/min (ref >60)
GFR calc non Af Amer: >60 mL/min (ref >60)
Glucose, Bld: 76 mg/dL (ref 70–99)
Potassium: 3.8 mmol/L (ref 3.5–5.1)
Sodium: 143 mmol/L (ref 135–145)
Total Bilirubin: 0.5 mg/dL (ref 0.3–1.2)
Total Protein: 6.4 g/dL — ABNORMAL LOW (ref 6.5–8.1)

## 2019-11-27 LAB — URINALYSIS, ROUTINE W REFLEX MICROSCOPIC
Bilirubin Urine: NEGATIVE
Glucose, UA: NEGATIVE mg/dL
Ketones, ur: NEGATIVE mg/dL
Nitrite: NEGATIVE
Protein, ur: NEGATIVE mg/dL
Specific Gravity, Urine: 1.008 (ref 1.005–1.030)
pH: 7 (ref 5.0–8.0)

## 2019-11-27 MED ORDER — ENALAPRIL MALEATE 10 MG PO TABS: 10.0000 mg | ORAL_TABLET | Freq: Every day | ORAL | 1 refills | Status: DC

## 2019-11-27 MED ORDER — HYDRALAZINE HCL 20 MG/ML IJ SOLN: 10.0000 mg | INTRAMUSCULAR | Status: DC | PRN

## 2019-11-27 MED ORDER — LABETALOL HCL 5 MG/ML IV SOLN
20.0000 mg | INTRAVENOUS | Status: DC | PRN
  Administered 2019-11-27: 20 mg via INTRAVENOUS

## 2019-11-27 MED ORDER — LABETALOL HCL 5 MG/ML IV SOLN
INTRAVENOUS | Status: AC
  Filled 2019-11-27: qty 4

## 2019-11-27 MED ORDER — LABETALOL HCL 5 MG/ML IV SOLN
40.0000 mg | INTRAVENOUS | Status: DC | PRN
  Administered 2019-11-27: 40 mg via INTRAVENOUS
  Filled 2019-11-27: qty 8

## 2019-11-27 MED ORDER — LACTATED RINGERS IV SOLN: INTRAVENOUS | Status: DC

## 2019-11-27 MED ORDER — LABETALOL HCL 5 MG/ML IV SOLN
80.0000 mg | INTRAVENOUS | Status: DC | PRN
  Administered 2019-11-27: 80 mg via INTRAVENOUS
  Filled 2019-11-27: qty 16

## 2019-11-27 NOTE — MAU Note (Signed)
. °  Whitney Vasquez is a 34 y.o. presents after her PP visit in the office today for an increase in blood pressure. PT had a 31 week fetal demise on Aug 12th. Denies any HA or changes in vision.  Onset of complaint: office visit this am Pain score: 0 Vitals:   11/27/19 1156 11/27/19 1201  BP: (!) 174/107   Pulse: 81   Resp:  16  Temp:  98 F (36.7 C)  SpO2:  100%     FHT: Lab orders placed from triage: UA

## 2019-11-27 NOTE — MAU Provider Note (Addendum)
Patient Whitney Vasquez is a 34 y.o. G1P0100 here at 1 week postpartum here with elevated bps after being seen at [facility] today. She delivered NSVD at 31 weeks with an IUFD (due to placental abruption s/p cocaine use, subsequent DIC). See inpatient notes for labor and delivery summary.   She was diagnosed with SARS-COVID-19 upon admission on 11/18/2019; denies any symptoms now.   She has no history of elevated blood pressure; she denies HA, blurry vision, floating spots, sudden all over swelling. She denies heavy vaginal bleeding; denies cramping and pain.  History     CSN: [id number]  Arrival date and time: 11/27/19 1137   None     Chief Complaint  Patient presents with  . Hypertension   Hypertension This is a new problem. The current episode started today. The problem is unchanged. Pertinent negatives include no anxiety, blurred vision, chest pain, headaches or shortness of breath. There are no associated agents to hypertension. Risk factors: Postpartum. Past treatments include nothing.   OB History    Gravida  1   Para  1   Term      Preterm  1   AB      Living        SAB      TAB      Ectopic      Multiple  0   Live Births  0           Past Medical History:  Diagnosis Date  . Asthma    inhaler prn    Past Surgical History:  Procedure Laterality Date  . NO PAST SURGERIES      Family History  Problem Relation Age of Onset  . COPD Maternal Grandmother   . Arthritis Maternal Grandmother   . Cancer Paternal Uncle     Social History   Tobacco Use  . Smoking status: Light Tobacco Smoker    Packs/day: 0.25    Types: Cigarettes    Last attempt to quit: 05/10/2019    Years since quitting: 0.5  . Smokeless tobacco: Never Used  Vaping Use  . Vaping Use: Never used  Substance Use Topics  . Alcohol use: Not Currently    Comment: every other day-beer  . Drug use: Not Currently    Types: Marijuana, Cocaine    Comment: not used in one month     Allergies: No Known Allergies  No medications prior to admission.    Review of Systems  Constitutional: Negative.   HENT: Negative.   Eyes: Negative for blurred vision.  Respiratory: Negative for shortness of breath.   Cardiovascular: Negative for chest pain.  Musculoskeletal: Negative.   Neurological: Negative for headaches.   Physical Exam   Blood pressure (!) 153/91, pulse 77, temperature 98 F (36.7 C), resp. rate 17, SpO2 100 %.  Physical Exam Constitutional:      Appearance: Normal appearance.  Pulmonary:     Effort: Pulmonary effort is normal.  Abdominal:     General: Abdomen is flat.  Musculoskeletal:        General: Normal range of motion.  Skin:    General: Skin is warm.  Neurological:     Mental Status: She is alert.     MAU Course  Procedures  MDM 20 mg of IV labetalol given at 1223, followed by 40 mg of labetelol and 80 mg of labetelol. BPs now in 140-150/70-90s. Patient continues to be asymptomatic.   -will draw pre-e  Labs; PCR not drawn> CBC,  CMP are normal -Airborne precautions; partner is also positive for COVID-19 -Review of BPs shows no elevated BPs while inpatient on 11/19/2019.   Patient Vitals for the past 24 hrs:  BP Temp Pulse Resp SpO2  11/27/19 1505 (!) 153/91 -- 77 17 --  11/27/19 1500 (!) 154/91 -- 80 -- --  11/27/19 1445 (!) 157/79 -- 67 -- --  11/27/19 1430 (!) 145/83 -- 72 -- --  11/27/19 1415 (!) 141/76 -- -- -- --  11/27/19 1400 (!) 155/84 -- -- -- --  11/27/19 1345 (!) 158/85 -- -- -- --  11/27/19 1330 (!) 158/87 -- 76 -- --  11/27/19 1315 (!) 153/91 -- 77 -- --  11/27/19 1300 (!) 147/83 -- -- -- --  11/27/19 1245 (!) 164/98 -- 72 -- --  11/27/19 1230 (!) 162/106 -- 82 -- --  11/27/19 1215 (!) 164/94 -- 76 -- --  11/27/19 1201 -- 98 F (36.7 C) -- 16 100 %  11/27/19 1156 (!) 174/107 -- 81 -- --     Assessment and Plan   1. Postpartum hypertension    -discussed with Dr. Alysia Penna, will start patient on enalapril  and have patient follow up for BP check on Tuesday at [facility]. Msg sent to [facility] to schedule. RX sent to pharmacy on file.  -all questions answered -strict return precautions given, including pre-e symptoms and plan for possible admission for Mg[facility] -All questions answered, patient agrees to start taking BP meds; verbalized understanding of return precautions.   Charlesetta Garibaldi Koni Kannan 11/27/2019, 5:41 PM

## 2019-11-27 NOTE — Discharge Instructions (Signed)
Preeclampsia and Eclampsia Preeclampsia is a serious condition that may develop during pregnancy. This condition causes high blood pressure and increased protein in your urine along with other symptoms, such as headaches and vision changes. These symptoms may develop as the condition gets worse. Preeclampsia may occur at 20 weeks of pregnancy or later. Diagnosing and treating preeclampsia early is very important. If not treated early, it can cause serious problems for you and your baby. One problem it can lead to is eclampsia. Eclampsia is a condition that causes muscle jerking or shaking (convulsions or seizures) and other serious problems for the mother. During pregnancy, delivering your baby may be the best treatment for preeclampsia or eclampsia. For most women, preeclampsia and eclampsia symptoms go away after giving birth. In rare cases, a woman may develop preeclampsia after giving birth (postpartum preeclampsia). This usually occurs within 48 hours after childbirth but may occur up to 6 weeks after giving birth. What are the causes? The cause of preeclampsia is not known. What increases the risk? The following risk factors make you more likely to develop preeclampsia:  Being pregnant for the first time.  Having had preeclampsia during a past pregnancy.  Having a family history of preeclampsia.  Having high blood pressure.  Being pregnant with more than one baby.  Being 35 or older.  Being African-American.  Having kidney disease or diabetes.  Having medical conditions such as lupus or blood diseases.  Being very overweight (obese). What are the signs or symptoms? The most common symptoms are:  Severe headaches.  Vision problems, such as blurred or double vision.  Abdominal pain, especially upper abdominal pain. Other symptoms that may develop as the condition gets worse include:  Sudden weight gain.  Sudden swelling of the hands, face, legs, and feet.  Severe nausea  and vomiting.  Numbness in the face, arms, legs, and feet.  Dizziness.  Urinating less than usual.  Slurred speech.  Convulsions or seizures. How is this diagnosed? There are no screening tests for preeclampsia. Your health care provider will ask you about symptoms and check for signs of preeclampsia during your prenatal visits. You may also have tests that include:  Checking your blood pressure.  Urine tests to check for protein. Your health care provider will check for this at every prenatal visit.  Blood tests.  Monitoring your baby's heart rate.  Ultrasound. How is this treated? You and your health care provider will determine the treatment approach that is best for you. Treatment may include:  Having more frequent prenatal exams to check for signs of preeclampsia, if you have an increased risk for preeclampsia.  Medicine to lower your blood pressure.  Staying in the hospital, if your condition is severe. There, treatment will focus on controlling your blood pressure and the amount of fluids in your body (fluid retention).  Taking medicine (magnesium sulfate) to prevent seizures. This may be given as an injection or through an IV.  Taking a low-dose aspirin during your pregnancy.  Delivering your baby early. You may have your labor started with medicine (induced), or you may have a cesarean delivery. Follow these instructions at home: Eating and drinking   Drink enough fluid to keep your urine pale yellow.  Avoid caffeine. Lifestyle  Do not use any products that contain nicotine or tobacco, such as cigarettes and e-cigarettes. If you need help quitting, ask your health care provider.  Do not use alcohol or drugs.  Avoid stress as much as possible. Rest and get   plenty of sleep. General instructions  Take over-the-counter and prescription medicines only as told by your health care provider.  When lying down, lie on your left side. This keeps pressure off your  major blood vessels.  When sitting or lying down, raise (elevate) your feet. Try putting some pillows underneath your lower legs.  Exercise regularly. Ask your health care provider what kinds of exercise are best for you.  Keep all follow-up and prenatal visits as told by your health care provider. This is important. How is this prevented? There is no known way of preventing preeclampsia or eclampsia from developing. However, to lower your risk of complications and detect problems early:  Get regular prenatal care. Your health care provider may be able to diagnose and treat the condition early.  Maintain a healthy weight. Ask your health care provider for help managing weight gain during pregnancy.  Work with your health care provider to manage any long-term (chronic) health conditions you have, such as diabetes or kidney problems.  You may have tests of your blood pressure and kidney function after giving birth.  Your health care provider may have you take low-dose aspirin during your next pregnancy. Contact a health care provider if:  You have symptoms that your health care provider told you may require more treatment or monitoring, such as: ? Headaches. ? Nausea or vomiting. ? Abdominal pain. ? Dizziness. ? Light-headedness. Get help right away if:  You have severe: ? Abdominal pain. ? Headaches that do not get better. ? Dizziness. ? Vision problems. ? Confusion. ? Nausea or vomiting.  You have any of the following: ? A seizure. ? Sudden, rapid weight gain. ? Sudden swelling in your hands, ankles, or face. ? Trouble moving any part of your body. ? Numbness in any part of your body. ? Trouble speaking. ? Abnormal bleeding.  You faint. Summary  Preeclampsia is a serious condition that may develop during pregnancy.  This condition causes high blood pressure and increased protein in your urine along with other symptoms, such as headaches and vision  changes.  Diagnosing and treating preeclampsia early is very important. If not treated early, it can cause serious problems for you and your baby.  Get help right away if you have symptoms that your health care provider told you to watch for. This information is not intended to replace advice given to you by your health care provider. Make sure you discuss any questions you have with your health care provider. Document Revised: 11/26/2017 Document Reviewed: 10/31/2015 Elsevier Patient Education  2020 Elsevier Inc.  

## 2019-11-27 NOTE — Progress Notes (Signed)
GYN VISIT Patient name: Whitney Vasquez MRN [id number]  Date of birth: 1985/08/07 Chief Complaint:   Follow-up  History of Present Illness:   Whitney Vasquez is a 34 y.o. G45P0100 African American female 8d s/p SVB after IOL for 31wk IUFD (abruption/DIC/cocaine) being seen today for f/u. Tearful at times, has good family support, has been staying w/ her mom since d/c'd from hospital. Occ headaches, seem to be only when she enters one room in her mom's house. Does not currently have a headache. Denies visual changes, ruq pain/epigastric pain, n/v. Reports she hasn't used cocaine at all since d/c from hospital on 8/12. Declines need for referral for rehab, states she is not going to use again. Does know that abruption/IUFD was likely caused from cocaine use, however she feels stress and physical requirements at work may have contributed as well, and does not plan to return to [employer].  Has good support system at home (mom/FOB/family/friends), does not want counseling/therapy right now. Denies SI/HI, declines need for antidepressants.  EPDS today 2.  Depression screen [facility] 2/9 10/28/2019 07/08/2019 05/27/2019  Decreased Interest 0 0 0  Down, Depressed, Hopeless 0 0 0  PHQ - 2 Score 0 0 0  Altered sleeping 0 1 -  Tired, decreased energy 0 1 -  Change in appetite 0 0 -  Feeling bad or failure about yourself  0 0 -  Trouble concentrating 0 0 -  Moving slowly or fidgety/restless 0 0 -  Suicidal thoughts 0 0 -  PHQ-9 Score 0 2 -  Difficult doing work/chores Not difficult at all Not difficult at all -    No LMP recorded. Review of Systems:   Pertinent items are noted in HPI Denies fever/chills, dizziness, headaches, visual disturbances, fatigue, shortness of breath, chest pain, abdominal pain, vomiting, abnormal vaginal discharge/itching/odor/irritation, problems with periods, bowel movements, urination, or intercourse unless otherwise stated above.  Pertinent History Reviewed:  Reviewed  past medical,surgical, social, obstetrical and family history.  Reviewed problem list, medications and allergies. Physical Assessment:   Vitals:   11/27/19 0000 11/27/19 0908 11/27/19 0930  BP: (!) 161/109 (!) 156/99 (!) 160/101  Pulse:  81   Weight:  202 lb (91.6 kg)   Height:  5\' 4"  (1.626 m)   Body mass index is 34.67 kg/m.       Physical Examination:   General appearance: alert, well appearing, and in no distress  Mental status: alert, oriented to person, place, and time  Skin: warm & dry   Cardiovascular: normal heart rate noted  Respiratory: normal respiratory effort, no distress  Abdomen: soft, non-tender   Pelvic: examination not indicated  Extremities: no edema    Chaperone: n/a    No results found for this or any previous visit (from the past 24 hour(s)).  Assessment & Plan:  1) 8d s/p SVB after IOL for IUFD (abruption/DIC/cocaine)  2) Severe range bp's> occ headaches, none today, otherwise asymptomatic. Denies cocaine use since d/c from hospital. Advised going straight to MAU for pp pre-e eval. Pt states she did not have a good experience at [facility] and really doesn't want to go back. Discussed [facility] could evaluate her, but if requires admission, she would be transferred to [facility] anyway, so best to [facility] to [facility]. Discussed potential risk of seizures and death if she does not seek care. Pt states she will go to [facility]U, but needs to go let her dog out first. Notified [facility], NP.   Meds: No orders of the  defined types were placed in this encounter.   No orders of the defined types were placed in this encounter.   Return in about 1 week (around 12/04/2019) for F/U, in person, MD or CNM.  Whitney Vasquez CNM, [facility] 11/27/2019 10:58 AM

## 2019-11-30 ENCOUNTER — Encounter: Payer: Self-pay | Admitting: *Deleted

## 2019-12-24 ENCOUNTER — Ambulatory Visit (INDEPENDENT_AMBULATORY_CARE_PROVIDER_SITE_OTHER): Payer: BC Managed Care – PPO | Admitting: Advanced Practice Midwife

## 2019-12-24 ENCOUNTER — Encounter: Payer: Self-pay | Admitting: Advanced Practice Midwife

## 2019-12-24 ENCOUNTER — Other Ambulatory Visit: Payer: Self-pay

## 2019-12-24 DIAGNOSIS — U071 COVID-19: Secondary | ICD-10-CM

## 2019-12-24 MED ORDER — ENALAPRIL MALEATE 10 MG PO TABS: 10.0000 mg | ORAL_TABLET | Freq: Every day | ORAL | 1 refills | Status: DC

## 2019-12-24 NOTE — Progress Notes (Signed)
Post Partum Visit Note  Whitney Vasquez is a 34 y.o. G25P0100 female who presents for a postpartum visit. She is 5 weeks postpartum following a normal spontaneous vaginal delivery.  I have fully reviewed the prenatal and intrapartum course. The delivery was at 31 gestational weeks. Had and IUFD d/t abruption d/t cocaine use.  Subsequent DIC.  Severe range BPs 1 week later, started on enalapril 10mg  outpt w/strict f/u. Last dose yesterday  Pt no showed/cancelled 5 appointements between then and now. Was out of town.  Virtual visits offeredAnesthesia: epidural. Bleeding no bleeding. Bowel function is normal. Bladder function is normal. Patient is not sexually active. Contraception method is none. Postpartum depression screening: negative.    Review of Systems Pertinent items are noted in HPI.   Objective:  Blood pressure (!) 166/99, pulse 83, height 5\' 4"  (1.626 m), weight 196 lb (88.9 kg), not currently breastfeeding.  General:  alert, cooperative, and no distress   Breasts:  negative  Lungs: Normal respiratory effort  Heart:  regular rate and rhythm  Abdomen: soft, non-tender,    Vulva:  normal  Vagina: normal vagina  Cervix:  normal  Corpus: Well involuted  Adnexa:  not evaluated  Rectal Exam: no hemorrhoids        Assessment:    normal postpartum exam. Pap smear not done at today's visit.  Severe range BPs--discussed w/[facility].  Increase meds to BID.  Pt strongly encouraged to take as directed. Keep an eye on BP at home.   Plan:   Essential components of care per ACOG recommendations:  1.  Mood and well being: Patient with negative depression screening today. Reviewed local resources for support.  - Patient does use tobacco. If using tobacco we discussed reduction and for recently cessation risk of relapse - hx of drug use? Yes   If yes, discussed support systems.  Declined desire for rehab -Social determinants of health (SDOH) reviewed in EPIC. No concerns=  3. Sexuality,  contraception and birth spacing - Patient does want a pregnancy in the next year.  Desired family size is Reviewed forms of contraception in tiered fashion. Patient desired no method today.   - Discussed birth spacing of 18 months  4. Sleep and fatigue -Encouraged family/partner/community support of 4 hrs of uninterrupted sleep to help with mood and fatigue  5. Physical Recovery  - Discussed patients delivery and complications.   - Patient had a 0 degree laceration, perineal healing reviewed. Patient expressed understanding - Patient has urinary incontinence? No Patient was referred to pelvic floor PT  - Patient is safe to resume physical and sexual activity  6.  Health Maintenance - Last pap smear done 07/08/19 and was normal with negative HPV.   7. Chronic Disease - PCP follow up  , CNM [facility], [facility]

## 2019-12-28 ENCOUNTER — Other Ambulatory Visit: Payer: Self-pay

## 2019-12-28 ENCOUNTER — Ambulatory Visit (INDEPENDENT_AMBULATORY_CARE_PROVIDER_SITE_OTHER): Payer: BC Managed Care – PPO | Admitting: *Deleted

## 2019-12-28 ENCOUNTER — Encounter: Payer: Self-pay | Admitting: *Deleted

## 2019-12-28 VITALS — BP 144/99 | HR 85 | Ht 64.0 in | Wt 195.8 lb

## 2019-12-28 DIAGNOSIS — Z0130 Encounter for examination of blood pressure without abnormal findings: Secondary | ICD-10-CM

## 2019-12-28 MED ORDER — HYDROCHLOROTHIAZIDE 12.5 MG PO TABS: 12.5000 mg | ORAL_TABLET | Freq: Every day | ORAL | 6 refills | Status: DC

## 2019-12-28 NOTE — Addendum Note (Signed)
Addended by: Cheral Marker on: 12/28/2019 03:25 PM   Modules accepted: Orders

## 2019-12-28 NOTE — Progress Notes (Addendum)
   NURSE VISIT- BLOOD PRESSURE CHECK  SUBJECTIVE:  Whitney Vasquez is a 34 y.o. G85P0100 female here for BP check. She is postpartum, delivery date 11/19/2019.    HYPERTENSION ROS:  Pregnant/postpartum:  . Severe headaches that don't go away with tylenol/other medicines: No  . Visual changes (seeing spots/double/blurred vision) No  . Severe pain under right breast breast or in center of upper chest No  . Severe nausea/vomiting No  . Taking medicines as instructed yes   OBJECTIVE:  BP (!) 144/99 (BP Location: Left Arm, Patient Position: Sitting, Cuff Size: Normal)   Pulse 85   Ht 5\' 4"  (1.626 m)   Wt 195 lb 12.8 oz (88.8 kg)   LMP 12/25/2019 (Exact Date)   BMI 33.61 kg/m   Appearance alert, well appearing, and in no distress.  ASSESSMENT: Postpartum  blood pressure check  PLAN: Discussed with 12/27/2019, CNM, [facility]   Recommendations: new prescription will be sent. Take medication one hour prior to appointment.  Follow-up: one week.    [facility]  12/28/2019 3:15 PM   Chart reviewed for nurse visit. Agree with plan of care. Continue vasotec 10mg  BID, add HCTZ 12.5mg , f/u 1wk bp check 12/30/2019, CNM 12/28/2019 3:24 PM

## 2020-01-01 ENCOUNTER — Encounter: Payer: Self-pay | Admitting: *Deleted

## 2020-01-04 ENCOUNTER — Ambulatory Visit (INDEPENDENT_AMBULATORY_CARE_PROVIDER_SITE_OTHER): Payer: BC Managed Care – PPO | Admitting: *Deleted

## 2020-01-04 ENCOUNTER — Other Ambulatory Visit: Payer: Self-pay

## 2020-01-04 VITALS — BP 133/89 | HR 101 | Ht 64.0 in | Wt 198.0 lb

## 2020-01-04 DIAGNOSIS — Z0130 Encounter for examination of blood pressure without abnormal findings: Secondary | ICD-10-CM

## 2020-01-04 NOTE — Progress Notes (Addendum)
   NURSE VISIT- BLOOD PRESSURE CHECK  SUBJECTIVE:  Whitney Vasquez is a 34 y.o. G44P0100 female here for BP check. She is postpartum, delivery date 11/19/19    HYPERTENSION ROS:  Pregnant/postpartum:  . Severe headaches that don't go away with tylenol/other medicines: No  . Visual changes (seeing spots/double/blurred vision) No  . Severe pain under right breast breast or in center of upper chest No  . Severe nausea/vomiting No  . Taking medicines as instructed yes   OBJECTIVE:  BP 133/89 (BP Location: Left Arm, Patient Position: Sitting, Cuff Size: Normal)   Pulse (!) 101   Ht 5\' 4"  (1.626 m)   Wt 198 lb (89.8 kg)   LMP 12/25/2019 (Exact Date)   BMI 33.99 kg/m   Appearance alert, well appearing, and in no distress.  ASSESSMENT: Postpartum  blood pressure check  PLAN: Discussed with 12/27/2019, CNM, [facility]   Recommendations: no changes needed   Follow-up: 6 months with provider   [facility]  01/04/2020 3:30 PM   I spoke with and examined patient and agree with resident/PA-S/MS/SNM's note and plan of care. Continue bp meds.  01/06/2020, CNM, [facility] 01/04/2020 3:44 PM

## 2020-03-01 ENCOUNTER — Other Ambulatory Visit: Payer: Self-pay | Admitting: Adult Health

## 2020-03-21 ENCOUNTER — Other Ambulatory Visit: Payer: Self-pay | Admitting: Women's Health

## 2020-03-21 MED ORDER — AMLODIPINE BESYLATE 10 MG PO TABS: 10.0000 mg | ORAL_TABLET | Freq: Every day | ORAL | 3 refills | Status: AC

## 2020-04-10 ENCOUNTER — Other Ambulatory Visit: Payer: Self-pay | Admitting: Advanced Practice Midwife

## 2020-07-16 ENCOUNTER — Other Ambulatory Visit: Payer: Self-pay | Admitting: Women's Health

## 2020-09-29 ENCOUNTER — Other Ambulatory Visit: Payer: Self-pay | Admitting: Adult Health

## 2021-02-17 ENCOUNTER — Other Ambulatory Visit: Payer: Self-pay | Admitting: Adult Health

## 2021-07-04 ENCOUNTER — Other Ambulatory Visit: Payer: Self-pay | Admitting: Adult Health

## 2021-07-05 ENCOUNTER — Telehealth: Payer: Self-pay | Admitting: Adult Health

## 2021-07-05 MED ORDER — ALBUTEROL SULFATE HFA 108 (90 BASE) MCG/ACT IN AERS: 2.0000 | INHALATION_SPRAY | Freq: Four times a day (QID) | RESPIRATORY_TRACT | 1 refills | Status: DC | PRN

## 2021-07-05 NOTE — Telephone Encounter (Signed)
Patient called wanting to know why her inhaler request got denied. I talked to [place] who said normally that is continued by a pcp. Patient states she has no pcp and wants to know if you can call her in a 1 refill, so she will have time to find a pcp. Please advise.  ?

## 2021-07-05 NOTE — Addendum Note (Signed)
Addended by: Cyril Mourning A on: 07/05/2021 05:22 PM ? ? Modules accepted: Orders ? ?

## 2021-07-05 NOTE — Telephone Encounter (Signed)
Will refill inhaler. ?

## 2021-10-19 ENCOUNTER — Other Ambulatory Visit: Payer: Self-pay | Admitting: Adult Health

## 2022-03-30 ENCOUNTER — Other Ambulatory Visit: Payer: Self-pay | Admitting: Adult Health

## 2023-10-09 ENCOUNTER — Other Ambulatory Visit: Payer: Self-pay | Admitting: Physician Assistant

## 2023-10-09 DIAGNOSIS — R748 Abnormal levels of other serum enzymes: Secondary | ICD-10-CM

## 2023-10-17 ENCOUNTER — Ambulatory Visit
Admission: RE | Admit: 2023-10-17 | Discharge: 2023-10-17 | Disposition: A | Discharge status: Home / Self Care | Source: Ambulatory Visit | Attending: Physician Assistant | Admitting: Physician Assistant

## 2023-10-17 DIAGNOSIS — R748 Abnormal levels of other serum enzymes: Secondary | ICD-10-CM

## 2023-11-06 ENCOUNTER — Other Ambulatory Visit: Payer: Self-pay | Admitting: Physician Assistant

## 2023-11-06 DIAGNOSIS — R748 Abnormal levels of other serum enzymes: Secondary | ICD-10-CM

## 2023-12-27 ENCOUNTER — Inpatient Hospital Stay

## 2023-12-27 ENCOUNTER — Inpatient Hospital Stay: Admitting: Oncology

## 2023-12-27 NOTE — Progress Notes (Deleted)
 [facility]  HEMATOLOGY CLINIC CONSULTATION NOTE    PATIENT NAME: Whitney Vasquez   MR#: [id number] DOB: Jun 08, 1985  DATE OF SERVICE: 12/27/2023  REFERRING PROVIDER:  ***  Patient Care Team: Whitney Vasquez, Whitney Vasquez as PCP - General (Physician Assistant)  REASON FOR CONSULTATION/ CHIEF COMPLAINT:  Evaluation of leukocytosis.   ASSESSMENT & PLAN:  Whitney Vasquez is a 38 y.o. female, with a past medical history of hypertension, dyslipidemia, polysubstance abuse including tobacco and marijuana, moderate persistent asthma, GERD, seasonal allergies, was referred to our service for evaluation of leukocytosis.    No problem-specific Assessment & Plan notes found for this encounter.   Assessment and Plan Assessment & Plan     I have counseled patient on smoking cessation, and offered nicotine patch, patient states that sheis going to try to quit.   I reviewed lab results and outside records for this visit and discussed relevant results with the patient. Diagnosis, plan of care and treatment options were also discussed in detail with the patient. Opportunity provided to ask questions and answers provided to her apparent satisfaction. Provided instructions to call our clinic with any problems, questions or concerns prior to return visit. I recommended to continue follow-up with PCP and sub-specialists. She verbalized understanding and agreed with the plan. No barriers to learning was detected.  Whitney Patten, Whitney Vasquez  12/27/2023 9:35 AM  [facility] - A DEPT OF Whitney Vasquez. [facility] [address] [place] [address] Dept: [phone] Dept Fax: [phone]   HISTORY OF PRESENTING ILLNESS:   Discussed the use of AI scribe software for clinical note transcription with the patient, who gave verbal consent to proceed.  History of Present Illness   On 10/29/2023, labs at her PCPs office showed white  count of 13,400 with ANC of 9600, normal differential.  Hemoglobin 14.7, platelet count 224,000.  Previously labs on 10/08/2023 showed white count of 17,900 with ANC of 12,000, ALC of 4100, monocyte count increased at 1600.  Given these findings, a referral was sent to us  for further evaluation.  ***She was found to have abnormal CBC from ***  ***She denies recent infection. The last prescription antibiotics was more than 3 months ago  Patient denies sinus congestion, cough, urinary frequency/urgency or dysuria, diarrhea, joint swelling/pain or abnormal skin rash.   ***She had no prior history or diagnosis of cancer. Her age appropriate screening programs are up-to-date.  The patient has no prior diagnosis of autoimmune disease and was not prescribed corticosteroids related products.  *** The patient is a smoker and currently smokes *** pack of cigarettes per day for the last *** years.  MEDICAL HISTORY:  Past Medical History:  Diagnosis Date   Asthma    inhaler prn    SURGICAL HISTORY: Past Surgical History:  Procedure Laterality Date   NO PAST SURGERIES      SOCIAL HISTORY: She reports that she has been smoking cigarettes. She has never used smokeless tobacco. She reports current alcohol use. She reports that she does not currently use drugs after having used the following drugs: Marijuana and Cocaine. Social History   Socioeconomic History   Marital status: Significant Other    Spouse name: Whitney Vasquez   Number of children: Not on file   Years of education: Not on file   Highest education level: Not on file  Occupational History   Not on file  Tobacco Use   Smoking status: Light  Smoker    Current packs/day: 0.00    Types: Cigarettes    Last attempt to quit: 05/10/2019    Years since quitting: 4.6   Smokeless tobacco: Never  Vaping Use   Vaping status: Never Used  Substance and Sexual Activity   Alcohol use: Yes    Comment: occ   Drug use: Not Currently     Types: Marijuana, Cocaine    Comment: not used in one month   Sexual activity: Not Currently    Birth control/protection: None  Other Topics Concern   Not on file  Social History Narrative   Not on file   Social Drivers of Health   Financial Resource Strain: Low Risk  (10/28/2019)   Overall Financial Resource Strain (CARDIA)    Difficulty of Paying Living Expenses: Not very hard  Food Insecurity: No Food Insecurity (10/28/2019)   Hunger Vital Sign    Worried About Running Out of Food in the Last Year: Never true    Ran Out of Food in the Last Year: Never true  Transportation Needs: No Transportation Needs (10/28/2019)   PRAPARE - Administrator, Civil Service (Medical): No    Lack of Transportation (Non-Medical): No  Physical Activity: Insufficiently Active (10/28/2019)   Exercise Vital Sign    Days of Exercise per Week: 2 days    Minutes of Exercise per Session: 10 min  Stress: No Stress Concern Present (10/28/2019)   [employer] of Occupational Health - Occupational Stress Questionnaire    Feeling of Stress : Not at all  Social Connections: Moderately Isolated (10/28/2019)   Social Connection and Isolation Panel    Frequency of Communication with Friends and Family: More than three times a week    Frequency of Social Gatherings with Friends and Family: Three times a week    Attends Religious Services: Never    Active Member of Clubs or Organizations: No    Attends Banker Meetings: Never    Marital Status: Living with partner  Intimate Partner Violence: Not At Risk (10/28/2019)   Humiliation, Afraid, Rape, and Kick questionnaire    Fear of Current or Ex-Partner: No    Emotionally Abused: No    Physically Abused: No    Sexually Abused: No    FAMILY HISTORY: Family History  Problem Relation Age of Onset   COPD Maternal Grandmother    Arthritis Maternal Grandmother    Cancer Paternal Uncle    Miscarriages / Stillbirths Daughter      ALLERGIES:  She has no known allergies.  MEDICATIONS:  Current Outpatient Medications  Medication Sig Dispense Refill   albuterol  (VENTOLIN  HFA) 108 (90 Base) MCG/ACT inhaler INHALE 2 PUFFS INTO THE LUNGS EVERY 6 HOURS AS NEEDED FOR WHEEZING OR SHORTNESS OF BREATH 18 g 1   amLODipine  (NORVASC ) 10 MG tablet Take 1 tablet (10 mg total) by mouth daily. 90 tablet 3   hydrochlorothiazide  (HYDRODIURIL ) 12.5 MG tablet TAKE 1 TABLET(12.5 MG) BY MOUTH DAILY 30 tablet 0   Naproxen Sodium (ALEVE PO) Take by mouth.     Prenatal Vit-Fe Fumarate-FA (PRENATAL VITAMIN PO) Take by mouth.     No current facility-administered medications for this visit.    REVIEW OF SYSTEMS:    Review of Systems - Oncology  All other pertinent systems were reviewed and were negative except as mentioned above.  PHYSICAL EXAMINATION:  ***     There were no vitals filed for this visit. There were no vitals filed for this  visit.  Physical Exam  ***  LABORATORY DATA:   I have reviewed the data as listed.  No results found for any visits on 12/27/23.  Lab Results  Component Value Date   WBC 11.7 (H) 11/27/2019   NEUTROABS 7.3 11/18/2019   HGB 9.8 (L) 11/27/2019   HCT 31.5 (L) 11/27/2019   MCV 85.8 11/27/2019   PLT 397 11/27/2019    No results found for: IRON, TIBC, FERRITIN  No results for input(s): NA, K, CL, CO2, GLUCOSE, BUN, CREATININE, CALCIUM, GFRNONAA, GFRAA, PROT, ALBUMIN, AST, ALT, ALKPHOS, BILITOT, BILIDIR, IBILI in the last 8760 hours.  No results found for this or any previous visit (from the past 72 hours).   RADIOGRAPHIC STUDIES:  I have personally reviewed the radiological images as listed and agree with the findings in the report.  No results found.  *** No recent pertinent imaging studies available to review.   I spent a total of {CHL ONC TIME VISIT - DTPQU:[id number]} during this encounter with the patient including review of  chart and various tests results, discussions about plan of care and coordination of care plan.  No orders of the defined types were placed in this encounter.    Future Appointments  Date Time Provider Department Center  12/27/2023 10:00 AM Odies Desa, [place], Whitney Vasquez [facility] None  12/27/2023 10:45 AM [facility] None       This document was completed utilizing speech recognition software. Grammatical errors, random word insertions, pronoun errors, and incomplete sentences are an occasional consequence of this system due to software limitations, ambient noise, and hardware issues. Any formal questions or concerns about the content, text or information contained within the body of this dictation should be directly addressed to the provider for clarification.

## 2024-01-24 ENCOUNTER — Telehealth: Payer: Self-pay | Admitting: Oncology

## 2024-01-24 NOTE — Telephone Encounter (Signed)
 Rescheduled patient appointments. Called and left a voicemail with new appointment day and time.

## 2024-02-05 ENCOUNTER — Inpatient Hospital Stay: Admitting: Oncology

## 2024-02-05 ENCOUNTER — Inpatient Hospital Stay

## 2024-02-05 NOTE — Progress Notes (Deleted)
 [facility]  HEMATOLOGY CLINIC CONSULTATION NOTE   PATIENT NAME: Whitney Vasquez   MR#: [id number] DOB: [birth year]  DATE OF SERVICE: 02/05/2024   REFERRING PROVIDER  ***  Patient Care Team: Shelton, Melissa J, PA-C as PCP - General (Physician Assistant)   REASON FOR CONSULTATION/ CHIEF COMPLAINT: ***   ASSESSMENT & PLAN:  Whitney Vasquez is a 38 y.o. female, with a past medical history of ***, was referred to our service for evaluation of ***.    No problem-specific Assessment & Plan notes found for this encounter.   Assessment and Plan Assessment & Plan       We will check a CBC with differential today.  We will also send a work-up for leukocytosis, including ESR, CRP, BCR /ABL, peripheral blood flow cytometry.  We will send the work-up for anemia, including ferritin, iron studies, vitamin B12 and folate level, MMA, SPEP and IFE, haptoglobin, reticulocyte count,  I have counseled patient on smoking cessation, and offered nicotine patch, patient states that sheis going to try to quit.  Low-dose CT scan for screening of lung cancer is indicated ***   I reviewed lab results and outside records for this visit and discussed relevant results with the patient. Diagnosis, plan of care and treatment options were also discussed in detail with the patient. Opportunity provided to ask questions and answers provided to her apparent satisfaction. Provided instructions to call our clinic with any problems, questions or concerns prior to return visit. I recommended to continue follow-up with PCP and sub-specialists. She verbalized understanding and agreed with the plan. No barriers to learning was detected.  Whitney Patten, MD  02/05/2024 12:58 PM  [facility] - A DEPT OF [facility] [address] [place] [address] Dept: [phone] Dept Fax: [phone]   HISTORY OF PRESENT  ILLNESS: ***  Discussed the use of AI scribe software for clinical note transcription with the patient, who gave verbal consent to proceed.  History of Present Illness      She denies fever, cough, diarrhea, or other infectious symptoms.  She denies epistaxis, bloody stool, melena, hematuria, bruising or other bleeding symptoms. She also denies unintentional weight loss, night sweats or other constitutional symptoms.  MEDICAL HISTORY Past Medical History:  Diagnosis Date   Asthma    inhaler prn     SURGICAL HISTORY Past Surgical History:  Procedure Laterality Date   NO PAST SURGERIES       SOCIAL HISTORY: She reports that she has been smoking cigarettes. She has never used smokeless tobacco. She reports current alcohol use. She reports that she does not currently use drugs after having used the following drugs: Marijuana and Cocaine. Social History   Socioeconomic History   Marital status: Significant Other    Spouse name: Whitney Vasquez   Number of children: Not on file   Years of education: Not on file   Highest education level: Not on file  Occupational History   Not on file  Tobacco Use   Smoking status: Light Smoker    Current packs/day: 0.00    Types: Cigarettes    Last attempt to quit: 05/10/2019    Years since quitting: 4.7   Smokeless tobacco: Never  Vaping Use   Vaping status: Never Used  Substance and Sexual Activity   Alcohol use: Yes    Comment: occ   Drug use: Not Currently    Types: Marijuana, Cocaine  Comment: not used in one month   Sexual activity: Not Currently    Birth control/protection: None  Other Topics Concern   Not on file  Social History Narrative   Not on file   Social Drivers of Health   Financial Resource Strain: Low Risk  (10/28/2019)   Overall Financial Resource Strain (CARDIA)    Difficulty of Paying Living Expenses: Not very hard  Food Insecurity: No Food Insecurity (10/28/2019)   Hunger Vital Sign    Worried About  Running Out of Food in the Last Year: Never true    Ran Out of Food in the Last Year: Never true  Transportation Needs: No Transportation Needs (10/28/2019)   PRAPARE - Administrator, Civil Service (Medical): No    Lack of Transportation (Non-Medical): No  Physical Activity: Insufficiently Active (10/28/2019)   Exercise Vital Sign    Days of Exercise per Week: 2 days    Minutes of Exercise per Session: 10 min  Stress: No Stress Concern Present (10/28/2019)   [employer] of Occupational Health - Occupational Stress Questionnaire    Feeling of Stress : Not at all  Social Connections: Moderately Isolated (10/28/2019)   Social Connection and Isolation Panel    Frequency of Communication with Friends and Family: More than three times a week    Frequency of Social Gatherings with Friends and Family: Three times a week    Attends Religious Services: Never    Active Member of Clubs or Organizations: No    Attends Banker Meetings: Never    Marital Status: Living with partner  Intimate Partner Violence: Not At Risk (10/28/2019)   Humiliation, Afraid, Rape, and Kick questionnaire    Fear of Current or Ex-Partner: No    Emotionally Abused: No    Physically Abused: No    Sexually Abused: No    FAMILY HISTORY: Her family history includes Arthritis in her maternal grandmother; COPD in her maternal grandmother; Cancer in her paternal uncle; Miscarriages / Stillbirths in her daughter.  CURRENT MEDICATIONS   Current Outpatient Medications  Medication Instructions   albuterol  (VENTOLIN  HFA) 108 (90 Base) MCG/ACT inhaler 2 puffs, Inhalation, Every 6 hours PRN   amLODipine  (NORVASC ) 10 mg, Oral, Daily   hydrochlorothiazide  (HYDRODIURIL ) 12.5 MG tablet TAKE 1 TABLET(12.5 MG) BY MOUTH DAILY   Naproxen Sodium (ALEVE PO) Oral   Prenatal Vit-Fe Fumarate-FA (PRENATAL VITAMIN PO) Oral     ALLERGIES  She has no known allergies.  REVIEW OF SYSTEMS:  Review of Systems -  Oncology   Rest of the pertinent review of systems is unremarkable except as mentioned above in HPI.  PHYSICAL EXAMINATION:  ***    There were no vitals filed for this visit. There were no vitals filed for this visit.  Physical Exam  ***  LABORATORY DATA:   I have reviewed the data as listed.  No results found for any visits on 02/05/24.   RADIOGRAPHIC STUDIES:  I have personally reviewed the radiological images as listed and agreed with the findings in the report.  No results found.  *** No pertinent imaging studies available to review.  No orders of the defined types were placed in this encounter.   Future Appointments  Date Time Provider Department Center  02/05/2024  2:00 PM Clydette Privitera, MD [facility] None  02/05/2024  2:45 PM [facility] None    I spent a total of {CHL ONC TIME VISIT - [id number]} during this encounter with the patient including  review of chart and various tests results, discussions about plan of care and coordination of care plan.  This document was completed utilizing speech recognition software. Grammatical errors, random word insertions, pronoun errors, and incomplete sentences are an occasional consequence of this system due to software limitations, ambient noise, and hardware issues. Any formal questions or concerns about the content, text or information contained within the body of this dictation should be directly addressed to the provider for clarification.

## 2024-04-17 ENCOUNTER — Telehealth: Payer: Self-pay | Admitting: Oncology

## 2024-04-17 ENCOUNTER — Inpatient Hospital Stay

## 2024-04-17 ENCOUNTER — Inpatient Hospital Stay: Admitting: Oncology

## 2024-05-15 ENCOUNTER — Encounter: Payer: Self-pay | Admitting: Oncology

## 2024-05-15 ENCOUNTER — Inpatient Hospital Stay: Admitting: Oncology

## 2024-05-15 ENCOUNTER — Inpatient Hospital Stay

## 2024-05-15 VITALS — BP 109/75 | HR 103 | Temp 97.6°F | Resp 19 | Wt 217.8 lb

## 2024-05-15 DIAGNOSIS — F1910 Other psychoactive substance abuse, uncomplicated: Secondary | ICD-10-CM | POA: Insufficient documentation

## 2024-05-15 DIAGNOSIS — D72829 Elevated white blood cell count, unspecified: Secondary | ICD-10-CM

## 2024-05-15 LAB — CBC WITH DIFFERENTIAL (CANCER CENTER ONLY)
Abs Immature Granulocytes: 0.06 10*3/uL (ref 0.00–0.07)
Basophils Absolute: 0.1 10*3/uL (ref 0.0–0.1)
Basophils Relative: 0 %
Eosinophils Absolute: 0.3 10*3/uL (ref 0.0–0.5)
Eosinophils Relative: 2 %
HCT: 44.6 % (ref 36.0–46.0)
Hemoglobin: 15.2 g/dL — ABNORMAL HIGH (ref 12.0–15.0)
Immature Granulocytes: 1 %
Lymphocytes Relative: 26 %
Lymphs Abs: 3 10*3/uL (ref 0.7–4.0)
MCH: 28.3 pg (ref 26.0–34.0)
MCHC: 34.1 g/dL (ref 30.0–36.0)
MCV: 82.9 fL (ref 80.0–100.0)
Monocytes Absolute: 0.7 10*3/uL (ref 0.1–1.0)
Monocytes Relative: 6 %
Neutro Abs: 7.6 10*3/uL (ref 1.7–7.7)
Neutrophils Relative %: 65 %
Platelet Count: 300 10*3/uL (ref 150–400)
RBC: 5.38 MIL/uL — ABNORMAL HIGH (ref 3.87–5.11)
RDW: 13.8 % (ref 11.5–15.5)
WBC Count: 11.6 10*3/uL — ABNORMAL HIGH (ref 4.0–10.5)
nRBC: 0 % (ref 0.0–0.2)

## 2024-05-15 LAB — C-REACTIVE PROTEIN: CRP: 1.6 mg/dL — ABNORMAL HIGH

## 2024-05-15 LAB — CMP (CANCER CENTER ONLY)
ALT: 35 U/L (ref 0–44)
AST: 33 U/L (ref 15–41)
Albumin: 4.8 g/dL (ref 3.5–5.0)
Alkaline Phosphatase: 130 U/L — ABNORMAL HIGH (ref 38–126)
Anion gap: 15 (ref 5–15)
BUN: <5 mg/dL — ABNORMAL LOW (ref 6–20)
CO2: 22 mmol/L (ref 22–32)
Calcium: 9.7 mg/dL (ref 8.9–10.3)
Chloride: 102 mmol/L (ref 98–111)
Creatinine: 0.89 mg/dL (ref 0.44–1.00)
GFR, Estimated: >60 mL/min
Glucose, Bld: 112 mg/dL — ABNORMAL HIGH (ref 70–99)
Potassium: 3.8 mmol/L (ref 3.5–5.1)
Sodium: 139 mmol/L (ref 135–145)
Total Bilirubin: 0.4 mg/dL (ref 0.0–1.2)
Total Protein: 8.3 g/dL — ABNORMAL HIGH (ref 6.5–8.1)

## 2024-05-15 LAB — SEDIMENTATION RATE: Sed Rate: 26 mm/h — ABNORMAL HIGH (ref 0–22)

## 2024-05-15 LAB — LACTATE DEHYDROGENASE: LDH: 179 U/L (ref 105–235)

## 2024-05-15 NOTE — Progress Notes (Signed)
 "  [facility]  HEMATOLOGY CLINIC CONSULTATION NOTE    PATIENT NAME: Whitney Vasquez   MR#: [id number] DOB: 11-10-1985  DATE OF SERVICE: 05/15/2024  REFERRING PROVIDER:  Theo Eleanor PARAS, PA-C   Patient Care Team: Shelton, Melissa J, PA-C as PCP - General (Physician Assistant)  REASON FOR CONSULTATION/ CHIEF COMPLAINT:  Evaluation of leukocytosis.   ASSESSMENT & PLAN:  Whitney Vasquez is a 39 y.o. female, with a past medical history of hypertension, asthma, tobacco abuse, polysubstance abuse, GERD, prediabetes, dyslipidemia, was referred to our service for evaluation of leukocytosis.    Leukocytosis On 10/29/2023, labs at her PCPs office showed white count of 13,400 with ANC of 9600, absolute monocyte count of 1000.  Hemoglobin was 14.7, platelet count 324,000.  Previously labs on 10/08/2023 showed white count of 17,900 with ANC of 12,000, ALC of 4100, absolute monocyte count of 1600.  She was referred to us  for further evaluation of leukocytosis.  Chronic leukocytosis with persistent elevation of white blood cell count since at least 2019, ranging from 12,000 to 25,000. Red blood cell and platelet counts remain within normal limits.   She has no recurrent infections, recent antibiotic use, or constitutional symptoms including unintentional weight loss, drenching night sweats, or fevers.   Asthma, intermittent tobacco and marijuana use, and occasional cocaine use may contribute to chronic inflammation and elevated leukocyte count.   No evidence currently suggests an acute bone marrow disorder such as leukemia or lymphoma, but further evaluation is indicated to rule out underlying pathology.  Repeat labs today showed persistent mild leukocytosis with white count of 11,600, with normal differential.  Hemoglobin 15.2, hematocrit 44.6.  Platelet count normal at 300,000.  CMP grossly unremarkable except for mildly elevated dose of 130.  LDH normal.  Both ESR and CRP  are mildly elevated at 22 mm/h and 1.6 respectively.  We will rule out any underlying myeloproliferative neoplasm and hence we obtained BCR/ABL 1 testing today.  Also obtain flow cytometry of peripheral blood.  - Arranged follow-up phone call in two weeks to review laboratory results. - Scheduled in-person follow-up in six months to reassess laboratory trends and clinical status. - Planned discharge from hematology follow-up if laboratory values remain stable and no concerning findings are identified on workup.  Polysubstance abuse (HCC) Discussed risks of polysubstance abuse, including malignancies from ongoing tobacco abuse.  Advised cessation.  Patient does not seem to be motivated to quit at this time.    I reviewed lab results and outside records for this visit and discussed relevant results with the patient. Diagnosis, plan of care and treatment options were also discussed in detail with the patient. Opportunity provided to ask questions and answers provided to her apparent satisfaction. Provided instructions to call our clinic with any problems, questions or concerns prior to return visit. I recommended to continue follow-up with PCP and sub-specialists. She verbalized understanding and agreed with the plan. No barriers to learning was detected.  Chinita Patten, MD  05/15/2024 7:04 PM  [facility] - A DEPT OF [name] Dept: [phone] Dept Fax: [phone]   HISTORY OF PRESENTING ILLNESS:   Discussed the use of AI scribe software for clinical note transcription with the patient, who gave verbal consent to proceed.  History of Present Illness Whitney Vasquez is a 39 year old woman who presents for evaluation of chronic leukocytosis.  On 10/29/2023, labs at her PCPs  office showed white count of 13,400 with ANC of 9600, absolute monocyte count of 1000.  Hemoglobin was 14.7,  platelet count 324,000.  Previously labs on 10/08/2023 showed white count of 17,900 with ANC of 12,000, ALC of 4100, absolute monocyte count of 1600.  She was referred to us  for further evaluation of leukocytosis.  She has had persistent leukocytosis since at least 2019, with initial identification in July 2025 when her white blood cell count was 13,400/L, and a prior value that month of 17,900/L. She notes a peak white blood cell count of 25,000/L during hospitalization in August 2021, which coincided with pregnancy and stillbirth.  She denies recurrent infections, including urinary tract infections, cough, or sinus infections, and has not required antibiotics since 2019. She denies unintentional weight loss, anorexia, drenching night sweats, chills, or fevers. She reports sweating while sleeping only when using marijuana, but no other constitutional symptoms.  She has asthma, managed with a prescribed inhaler initiated after an episode of pneumonia and bronchitis in 2019. She has not used oral steroids except for a brief course during hospitalization and has not used steroid inhalers. She smokes approximately ten cigarettes every other day, uses marijuana (abstinent for the past two weeks), and reports occasional cocaine use.     MEDICAL HISTORY:  Past Medical History:  Diagnosis Date   Asthma    inhaler prn   Dyslipidemia    GERD (gastroesophageal reflux disease)    Hypertension    Polysubstance abuse (HCC)     SURGICAL HISTORY: Past Surgical History:  Procedure Laterality Date   NO PAST SURGERIES      SOCIAL HISTORY: She reports that she has been smoking cigarettes. She has been smoking an average of 0.3 packs per day. She has never used smokeless tobacco. She reports current alcohol use. She reports current drug use. Drugs: Marijuana and Cocaine. Social History   Socioeconomic History   Marital status: Significant Other    Spouse name: Lamar Matt   Number of children: Not  on file   Years of education: Not on file   Highest education level: Not on file  Occupational History   Not on file  Tobacco Use   Smoking status: Light Smoker    Current packs/day: 0.00    Average packs/day: 0.3 packs/day    Types: Cigarettes    Last attempt to quit: 05/10/2019    Years since quitting: 5.0   Smokeless tobacco: Never  Vaping Use   Vaping status: Never Used  Substance and Sexual Activity   Alcohol use: Yes    Comment: occ   Drug use: Yes    Types: Marijuana, Cocaine    Comment: not used in one month   Sexual activity: Not Currently    Birth control/protection: None  Other Topics Concern   Not on file  Social History Narrative   Not on file   Social Drivers of Health   Tobacco Use: High Risk (05/15/2024)   Patient History    Smoking Tobacco Use: Light Smoker    Smokeless Tobacco Use: Never    Passive Exposure: Not on file  Financial Resource Strain: Not on file  Food Insecurity: No Food Insecurity (05/15/2024)   Epic    Worried About Programme Researcher, Broadcasting/film/video in the Last Year: Never true    Ran Out of Food in the Last Year: Never true  Transportation Needs: No Transportation Needs (05/15/2024)   Epic    Lack of Transportation (Medical): No    Lack of  Transportation (Non-Medical): No  Physical Activity: Not on file  Stress: Not on file  Social Connections: Not on file  Intimate Partner Violence: Not At Risk (05/15/2024)   Epic    Fear of Current or Ex-Partner: No    Emotionally Abused: No    Physically Abused: No    Sexually Abused: No  Depression (PHQ2-9): Low Risk (05/15/2024)   Depression (PHQ2-9)    PHQ-2 Score: 0  Alcohol Screen: Not on file  Housing: High Risk (05/15/2024)   Epic    Unable to Pay for Housing in the Last Year: Yes    Number of Times Moved in the Last Year: 0    Homeless in the Last Year: No  Utilities: Not At Risk (05/15/2024)   Epic    Threatened with loss of utilities: No  Health Literacy: Not on file    FAMILY HISTORY: Family  History  Problem Relation Age of Onset   COPD Maternal Grandmother    Arthritis Maternal Grandmother    Cancer Paternal Uncle    Miscarriages / Stillbirths Daughter     ALLERGIES:  She has no known allergies.  MEDICATIONS:  Current Outpatient Medications  Medication Sig Dispense Refill   albuterol  (VENTOLIN  HFA) 108 (90 Base) MCG/ACT inhaler INHALE 2 PUFFS INTO THE LUNGS EVERY 6 HOURS AS NEEDED FOR WHEEZING OR SHORTNESS OF BREATH 18 g 1   amLODipine  (NORVASC ) 10 MG tablet Take 1 tablet (10 mg total) by mouth daily. 90 tablet 3   budesonide-formoterol (SYMBICORT) 80-4.5 MCG/ACT inhaler Inhale 2 puffs into the lungs 2 (two) times daily.     cetirizine (ZYRTEC) 10 MG tablet Take 10 mg by mouth daily.     cyclobenzaprine (FLEXERIL) 10 MG tablet Take 10 mg by mouth 3 (three) times daily as needed for muscle spasms (right/left rib cage spasms).     famotidine  (PEPCID ) 20 MG tablet Take 20 mg by mouth 2 (two) times daily as needed for heartburn.     fluticasone (FLONASE) 50 MCG/ACT nasal spray Place 1 spray into both nostrils 2 (two) times daily.     fluticasone (FLOVENT HFA) 110 MCG/ACT inhaler Inhale 2 puffs into the lungs 2 (two) times daily.     folic acid (FOLVITE) 1 MG tablet Take 1 mg by mouth daily.     hydrochlorothiazide  (HYDRODIURIL ) 12.5 MG tablet TAKE 1 TABLET(12.5 MG) BY MOUTH DAILY 30 tablet 0   Vitamin D, Ergocalciferol, (DRISDOL) 1.25 MG (50000 UNIT) CAPS capsule Take 50,000 Units by mouth once a week.     No current facility-administered medications for this visit.    REVIEW OF SYSTEMS:    Review of Systems - Oncology  All other pertinent systems were reviewed and were negative except as mentioned above.  PHYSICAL EXAMINATION:    Onc Performance Status - 05/15/24 1023       ECOG Perf Status   ECOG Perf Status Fully active, able to carry on all pre-disease performance without restriction      KPS SCALE   KPS % SCORE Normal, no compliants, no evidence of disease           Vitals:   05/15/24 1009  BP: 109/75  Pulse: (!) 103  Resp: 19  Temp: 97.6 F (36.4 C)  SpO2: 99%   Filed Weights   05/15/24 1009  Weight: 217 lb 12.8 oz (98.8 kg)    Physical Exam Constitutional:      General: She is not in acute distress.    Appearance: Normal appearance.  HENT:     Head: Normocephalic and atraumatic.  Cardiovascular:     Rate and Rhythm: Normal rate.  Pulmonary:     Effort: Pulmonary effort is normal. No respiratory distress.  Abdominal:     General: There is no distension.  Neurological:     General: No focal deficit present.     Mental Status: She is alert and oriented to person, place, and time.  Psychiatric:        Mood and Affect: Mood normal.        Behavior: Behavior normal.      LABORATORY DATA:   I have reviewed the data as listed.  Results for orders placed or performed in visit on 05/15/24  CBC with Differential (Cancer Center Only)  Result Value Ref Range   WBC Count 11.6 (H) 4.0 - 10.5 K/uL   RBC 5.38 (H) 3.87 - 5.11 MIL/uL   Hemoglobin 15.2 (H) 12.0 - 15.0 g/dL   HCT 55.3 63.9 - 53.9 %   MCV 82.9 80.0 - 100.0 fL   MCH 28.3 26.0 - 34.0 pg   MCHC 34.1 30.0 - 36.0 g/dL   RDW 86.1 88.4 - 84.4 %   Platelet Count 300 150 - 400 K/uL   nRBC 0.0 0.0 - 0.2 %   Neutrophils Relative % 65 %   Neutro Abs 7.6 1.7 - 7.7 K/uL   Lymphocytes Relative 26 %   Lymphs Abs 3.0 0.7 - 4.0 K/uL   Monocytes Relative 6 %   Monocytes Absolute 0.7 0.1 - 1.0 K/uL   Eosinophils Relative 2 %   Eosinophils Absolute 0.3 0.0 - 0.5 K/uL   Basophils Relative 0 %   Basophils Absolute 0.1 0.0 - 0.1 K/uL   Immature Granulocytes 1 %   Abs Immature Granulocytes 0.06 0.00 - 0.07 K/uL  CMP (Cancer Center only)  Result Value Ref Range   Sodium 139 135 - 145 mmol/L   Potassium 3.8 3.5 - 5.1 mmol/L   Chloride 102 98 - 111 mmol/L   CO2 22 22 - 32 mmol/L   Glucose, Bld 112 (H) 70 - 99 mg/dL   BUN <5 (L) 6 - 20 mg/dL   Creatinine 9.10 9.55 - 1.00  mg/dL   Calcium 9.7 8.9 - 89.6 mg/dL   Total Protein 8.3 (H) 6.5 - 8.1 g/dL   Albumin 4.8 3.5 - 5.0 g/dL   AST 33 15 - 41 U/L   ALT 35 0 - 44 U/L   Alkaline Phosphatase 130 (H) 38 - 126 U/L   Total Bilirubin 0.4 0.0 - 1.2 mg/dL   GFR, Estimated >39 >39 mL/min   Anion gap 15 5 - 15  Lactate dehydrogenase  Result Value Ref Range   LDH 179 105 - 235 U/L  Sedimentation rate  Result Value Ref Range   Sed Rate 26 (H) 0 - 22 mm/hr  C-reactive protein  Result Value Ref Range   CRP 1.6 (H) <1.0 mg/dL      RADIOGRAPHIC STUDIES:  No recent pertinent imaging studies available to review.   Orders Placed This Encounter  Procedures   CBC with Differential (Cancer Center Only)    Standing Status:   Future    Number of Occurrences:   1    Expiration Date:   05/15/2025   CMP (Cancer Center only)    Standing Status:   Future    Number of Occurrences:   1    Expiration Date:   05/15/2025   Lactate dehydrogenase  Standing Status:   Future    Number of Occurrences:   1    Expiration Date:   05/15/2025   Sedimentation rate    Standing Status:   Future    Number of Occurrences:   1    Expiration Date:   05/15/2025   C-reactive protein    Standing Status:   Future    Number of Occurrences:   1    Expiration Date:   05/15/2025   Flow Cytometry, Peripheral Blood (Oncology)    Patient with chronic leukocytosis.  Please evaluate with leukemia panel.    Standing Status:   Future    Number of Occurrences:   1    Expiration Date:   05/15/2025   BCR-ABL1 FISH    Standing Status:   Future    Number of Occurrences:   1    Expiration Date:   05/15/2025     Future Appointments  Date Time Provider Department Center  05/29/2024  1:00 PM Teyana Pierron, MD [facility] None  11/12/2024  9:00 AM [facility] None  11/12/2024  9:30 AM Aerielle Stoklosa, MD [facility] None     I spent a total of 55 minutes during this encounter with the patient including review of chart and various tests  results, discussions about plan of care and coordination of care plan.  This document was completed utilizing speech recognition software. Grammatical errors, random word insertions, pronoun errors, and incomplete sentences are an occasional consequence of this system due to software limitations, ambient noise, and hardware issues. Any formal questions or concerns about the content, text or information contained within the body of this dictation should be directly addressed to the provider for clarification.   "

## 2024-05-15 NOTE — Assessment & Plan Note (Signed)
 On 10/29/2023, labs at her PCPs office showed white count of 13,400 with ANC of 9600, absolute monocyte count of 1000.  Hemoglobin was 14.7, platelet count 324,000.  Previously labs on 10/08/2023 showed white count of 17,900 with ANC of 12,000, ALC of 4100, absolute monocyte count of 1600.  She was referred to us  for further evaluation of leukocytosis.  Chronic leukocytosis with persistent elevation of white blood cell count since at least 2019, ranging from 12,000 to 25,000. Red blood cell and platelet counts remain within normal limits.   She has no recurrent infections, recent antibiotic use, or constitutional symptoms including unintentional weight loss, drenching night sweats, or fevers.   Asthma, intermittent tobacco and marijuana use, and occasional cocaine use may contribute to chronic inflammation and elevated leukocyte count.   No evidence currently suggests an acute bone marrow disorder such as leukemia or lymphoma, but further evaluation is indicated to rule out underlying pathology.  Repeat labs today showed persistent mild leukocytosis with white count of 11,600, with normal differential.  Hemoglobin 15.2, hematocrit 44.6.  Platelet count normal at 300,000.  CMP grossly unremarkable except for mildly elevated dose of 130.  LDH normal.  Both ESR and CRP are mildly elevated at 22 mm/h and 1.6 respectively.  We will rule out any underlying myeloproliferative neoplasm and hence we obtained BCR/ABL 1 testing today.  Also obtain flow cytometry of peripheral blood.  - Arranged follow-up phone call in two weeks to review laboratory results. - Scheduled in-person follow-up in six months to reassess laboratory trends and clinical status. - Planned discharge from hematology follow-up if laboratory values remain stable and no concerning findings are identified on workup.

## 2024-05-15 NOTE — Assessment & Plan Note (Addendum)
 Discussed risks of polysubstance abuse, including malignancies from ongoing tobacco abuse.  Advised cessation.  Patient does not seem to be motivated to quit at this time.

## 2024-05-29 ENCOUNTER — Inpatient Hospital Stay: Admitting: Oncology

## 2024-11-12 ENCOUNTER — Inpatient Hospital Stay: Admitting: Oncology

## 2024-11-12 ENCOUNTER — Inpatient Hospital Stay
